# Patient Record
Sex: Female | Born: 1965 | Race: White | Hispanic: No | Marital: Married | Smoking: Former smoker
Health system: Southern US, Community
[De-identification: ages and names within clinical notes are randomized; demographics above are authoritative.]

## PROBLEM LIST (undated history)

## (undated) DIAGNOSIS — R000 Tachycardia, unspecified: Secondary | ICD-10-CM

## (undated) DIAGNOSIS — M810 Age-related osteoporosis without current pathological fracture: Secondary | ICD-10-CM

## (undated) DIAGNOSIS — M4310 Spondylolisthesis, site unspecified: Secondary | ICD-10-CM

## (undated) DIAGNOSIS — I10 Essential (primary) hypertension: Secondary | ICD-10-CM

## (undated) DIAGNOSIS — F419 Anxiety disorder, unspecified: Secondary | ICD-10-CM

## (undated) DIAGNOSIS — E7800 Pure hypercholesterolemia, unspecified: Secondary | ICD-10-CM

## (undated) DIAGNOSIS — E559 Vitamin D deficiency, unspecified: Secondary | ICD-10-CM

## (undated) HISTORY — DX: Age-related osteoporosis without current pathological fracture: M81.0

## (undated) HISTORY — DX: Spondylolisthesis, site unspecified: M43.10

## (undated) HISTORY — DX: Pure hypercholesterolemia, unspecified: E78.00

## (undated) HISTORY — DX: Anxiety disorder, unspecified: F41.9

## (undated) HISTORY — DX: Tachycardia, unspecified: R00.0

## (undated) HISTORY — DX: Vitamin D deficiency, unspecified: E55.9

## (undated) HISTORY — PX: CERVICAL BIOPSY  W/ LOOP ELECTRODE EXCISION: SUR135

## (undated) HISTORY — PX: TUBAL LIGATION: SHX77

## (undated) HISTORY — PX: CHOLECYSTECTOMY: SHX55

## (undated) HISTORY — DX: Essential (primary) hypertension: I10

---

## 1998-12-01 ENCOUNTER — Other Ambulatory Visit: Admission: RE | Admit: 1998-12-01 | Discharge: 1998-12-01 | Payer: Self-pay | Admitting: Family Medicine

## 1998-12-02 ENCOUNTER — Ambulatory Visit (HOSPITAL_COMMUNITY): Admission: RE | Admit: 1998-12-02 | Discharge: 1998-12-02 | Payer: Self-pay | Admitting: Family Medicine

## 1998-12-02 ENCOUNTER — Encounter: Payer: Self-pay | Admitting: Family Medicine

## 1998-12-21 ENCOUNTER — Other Ambulatory Visit: Admission: RE | Admit: 1998-12-21 | Discharge: 1998-12-21 | Payer: Self-pay | Admitting: *Deleted

## 1999-02-06 ENCOUNTER — Ambulatory Visit (HOSPITAL_COMMUNITY): Admission: RE | Admit: 1999-02-06 | Discharge: 1999-02-06 | Payer: Self-pay | Admitting: *Deleted

## 1999-06-02 ENCOUNTER — Other Ambulatory Visit: Admission: RE | Admit: 1999-06-02 | Discharge: 1999-06-02 | Payer: Self-pay | Admitting: *Deleted

## 1999-11-17 ENCOUNTER — Other Ambulatory Visit: Admission: RE | Admit: 1999-11-17 | Discharge: 1999-11-17 | Payer: Self-pay | Admitting: Obstetrics and Gynecology

## 2001-07-30 HISTORY — PX: ABDOMINAL HYSTERECTOMY: SHX81

## 2001-11-18 ENCOUNTER — Other Ambulatory Visit: Admission: RE | Admit: 2001-11-18 | Discharge: 2001-11-18 | Payer: Self-pay | Admitting: *Deleted

## 2002-05-26 ENCOUNTER — Encounter (INDEPENDENT_AMBULATORY_CARE_PROVIDER_SITE_OTHER): Payer: Self-pay | Admitting: *Deleted

## 2002-05-26 ENCOUNTER — Observation Stay (HOSPITAL_COMMUNITY): Admission: RE | Admit: 2002-05-26 | Discharge: 2002-05-27 | Payer: Self-pay | Admitting: Gynecology

## 2002-06-10 ENCOUNTER — Ambulatory Visit (HOSPITAL_COMMUNITY): Admission: RE | Admit: 2002-06-10 | Discharge: 2002-06-10 | Payer: Self-pay | Admitting: Gynecology

## 2002-06-10 ENCOUNTER — Encounter: Payer: Self-pay | Admitting: Gynecology

## 2002-10-23 ENCOUNTER — Encounter: Payer: Self-pay | Admitting: General Surgery

## 2002-10-28 ENCOUNTER — Encounter (INDEPENDENT_AMBULATORY_CARE_PROVIDER_SITE_OTHER): Payer: Self-pay | Admitting: Specialist

## 2002-10-28 ENCOUNTER — Encounter: Payer: Self-pay | Admitting: General Surgery

## 2002-10-28 ENCOUNTER — Observation Stay (HOSPITAL_COMMUNITY): Admission: RE | Admit: 2002-10-28 | Discharge: 2002-10-29 | Payer: Self-pay | Admitting: General Surgery

## 2010-02-14 ENCOUNTER — Encounter: Payer: Self-pay | Admitting: Internal Medicine

## 2010-03-18 ENCOUNTER — Encounter: Payer: Self-pay | Admitting: Internal Medicine

## 2010-03-27 ENCOUNTER — Other Ambulatory Visit: Admission: RE | Admit: 2010-03-27 | Discharge: 2010-03-27 | Payer: Self-pay | Admitting: Gynecology

## 2010-03-27 ENCOUNTER — Ambulatory Visit: Payer: Self-pay | Admitting: Gynecology

## 2010-03-27 ENCOUNTER — Ambulatory Visit (HOSPITAL_COMMUNITY): Admission: RE | Admit: 2010-03-27 | Discharge: 2010-03-27 | Payer: Self-pay | Admitting: Family Medicine

## 2010-03-31 ENCOUNTER — Encounter (INDEPENDENT_AMBULATORY_CARE_PROVIDER_SITE_OTHER): Payer: Self-pay | Admitting: *Deleted

## 2010-05-12 ENCOUNTER — Ambulatory Visit: Payer: Self-pay | Admitting: Internal Medicine

## 2010-05-12 ENCOUNTER — Encounter (INDEPENDENT_AMBULATORY_CARE_PROVIDER_SITE_OTHER): Payer: Self-pay | Admitting: *Deleted

## 2010-05-12 DIAGNOSIS — R198 Other specified symptoms and signs involving the digestive system and abdomen: Secondary | ICD-10-CM | POA: Insufficient documentation

## 2010-05-12 DIAGNOSIS — K219 Gastro-esophageal reflux disease without esophagitis: Secondary | ICD-10-CM

## 2010-05-12 DIAGNOSIS — K5909 Other constipation: Secondary | ICD-10-CM | POA: Insufficient documentation

## 2010-05-15 ENCOUNTER — Telehealth: Payer: Self-pay | Admitting: Internal Medicine

## 2010-05-18 ENCOUNTER — Telehealth: Payer: Self-pay | Admitting: Internal Medicine

## 2010-05-18 DIAGNOSIS — R740 Nonspecific elevation of levels of transaminase and lactic acid dehydrogenase [LDH]: Secondary | ICD-10-CM

## 2010-05-23 ENCOUNTER — Ambulatory Visit: Payer: Self-pay | Admitting: Internal Medicine

## 2010-05-23 LAB — CONVERTED CEMR LAB
ALT: 64 units/L — ABNORMAL HIGH (ref 0–35)
AST: 57 units/L — ABNORMAL HIGH (ref 0–37)
Albumin: 4.4 g/dL (ref 3.5–5.2)
Alkaline Phosphatase: 86 units/L (ref 39–117)
Bilirubin, Direct: 0 mg/dL (ref 0.0–0.3)
Total Bilirubin: 0.4 mg/dL (ref 0.3–1.2)
Total Protein: 7.5 g/dL (ref 6.0–8.3)

## 2010-05-25 ENCOUNTER — Ambulatory Visit: Payer: Self-pay | Admitting: Internal Medicine

## 2010-07-17 ENCOUNTER — Ambulatory Visit: Payer: Self-pay | Admitting: Internal Medicine

## 2010-07-18 ENCOUNTER — Ambulatory Visit (HOSPITAL_COMMUNITY)
Admission: RE | Admit: 2010-07-18 | Discharge: 2010-07-18 | Payer: Self-pay | Source: Home / Self Care | Attending: Family Medicine | Admitting: Family Medicine

## 2010-07-21 ENCOUNTER — Emergency Department (HOSPITAL_COMMUNITY)
Admission: EM | Admit: 2010-07-21 | Discharge: 2010-07-21 | Payer: Self-pay | Source: Home / Self Care | Admitting: Emergency Medicine

## 2010-08-29 NOTE — Letter (Signed)
Summary: [facility] Instructions  [facility]  [address] [place], [place] [address]   Phone: [phone]  Fax: [phone]       [facility]    12-Jan-1966    MRN: [id number]      Procedure Day Dorna Bloom: Lenor Coffin, 05/25/10     Arrival Time: 10 AM      Procedure Time: 11 AM    Location of Procedure:                    _X_   Endoscopy Center (4th Floor)                    PREPARATION FOR COLONOSCOPY WITH MOVIPREP   Starting 5 days prior to your procedure 05/20/10 do not eat nuts, seeds, popcorn, corn, beans, peas,  salads, or any raw vegetables.  Do not take any fiber supplements (e.g. Metamucil, Citrucel, and Benefiber).  THE DAY BEFORE YOUR PROCEDURE         WEDNESDAY, 05/24/10  1.  Drink clear liquids the entire day-NO SOLID FOOD  2.  Do not drink anything colored red or purple.  Avoid juices with pulp.  No orange juice.  3.  Drink at least 64 oz. (8 glasses) of fluid/clear liquids during the day to prevent dehydration and help the prep work efficiently.  CLEAR LIQUIDS INCLUDE: Water Jello Ice Popsicles Tea (sugar ok, no milk/cream) Powdered fruit flavored drinks Coffee (sugar ok, no milk/cream) Gatorade Juice: apple, white grape, white cranberry  Lemonade Clear bullion, consomm, broth Carbonated beverages (any kind) Strained chicken noodle soup Hard Candy                          4.  In the morning, mix first dose of MoviPrep solution:    Empty 1 Pouch A and 1 Pouch B into the disposable container    Add lukewarm drinking water to the top line of the container. Mix to dissolve    Refrigerate (mixed solution should be used within 24 hrs)  5.  Begin drinking the prep at 5:00 p.m. The MoviPrep container is divided by 4 marks.   Every 15 minutes drink the solution down to the next mark (approximately 8 oz) until the full liter is complete.   6.  Follow completed prep with 16 oz of clear liquid of your choice (Nothing red or purple).   Continue to drink clear liquids until bedtime.  7.  Before going to bed, mix second dose of MoviPrep solution:    Empty 1 Pouch A and 1 Pouch B into the disposable container    Add lukewarm drinking water to the top line of the container. Mix to dissolve    Refrigerate  THE DAY OF YOUR PROCEDURE      THURSDAY, 05/25/10  Beginning at 6:00 a.m. (5 hours before procedure):         1. Every 15 minutes, drink the solution down to the next mark (approx 8 oz) until the full liter is complete.  2. Follow completed prep with 16 oz. of clear liquid of your choice.    3. You may drink clear liquids until 9:00 AM (2 HOURS BEFORE PROCEDURE).  MEDICATION INSTRUCTIONS  Unless otherwise instructed, you should take regular prescription medications with a small sip of water   as early as possible the morning of your procedure.       OTHER INSTRUCTIONS  You will need a responsible  adult at least 46 years of age to accompany you and drive you home.   This person must remain in the waiting room during your procedure.  Wear loose fitting clothing that is easily removed.  Leave jewelry and other valuables at home.  However, you may wish to bring a book to read or  an iPod/MP3 player to listen to music as you wait for your procedure to start.  Remove all body piercing jewelry and leave at home.  Total time from sign-in until discharge is approximately 2-3 hours.  You should go home directly after your procedure and rest.  You can resume normal activities the  day after your procedure.  The day of your procedure you should not:   Drive   Make legal decisions   Operate machinery   Drink alcohol   Return to work  You will receive specific instructions about eating, activities and medications before you leave.   The above instructions have been reviewed and explained to me by   _______________________    I fully understand and can verbalize these instructions  _____________________________ Date _________

## 2010-08-29 NOTE — Progress Notes (Signed)
Summary: needs LFT's  Phone Note Outgoing Call   Summary of Call: Please ask her to do LFT's transaminases up in July dx 790.4 if she cannot do til colonoscopy  that is ok Iva Boop MD, [facility]  May 18, 2010 7:56 AM   Follow-up for Phone Call        Pt notified and will come early for her procedure to have drawn at that time. Order in IDX. Follow-up by: Francee Piccolo CMA Duncan Dull),  May 18, 2010 3:27 PM  New Problems: ABNORMAL TRANSAMINASE, (LFT'S) (ICD-790.4)   New Problems: ABNORMAL TRANSAMINASE, (LFT'S) (ICD-790.4)    Appended Document: needs LFT's RC from pt. She is uncomfortable coming the day of her procedure so she will come today for labs.

## 2010-08-29 NOTE — Assessment & Plan Note (Signed)
Summary: constipation//acid reflux--ch.   History of Present Illness Visit Type: Initial Consult Primary GI MD: Stan Head MD [facility] Primary Provider: Karleen Hampshire, MD Requesting Provider: Karleen Hampshire, MD Chief Complaint: alternating diarrhea/constipation and acid reflux primarily at night History of Present Illness:   45 yo ww with 1 year history of constipaton problems. No defecation for up to 9 days then several days of multiple small and soft bowel movements  (while on mineral oil). MiraLax was tried once daily x 5 days without much benefit. Has had constipation for years but worse in last year. Mineral oil works. Stool softeners no help. Also bloated and gassy.  she will get some rges but puts them off "I am shy" and then will take up to an hour to defecate with straining and incomplete defecation. Thinks food digets slowly.  Heartburn has been a problem, manly at night for 3-4 months, sharp pain in epigastrium. this ocurs at about 1-2 AM and awakens her.  Will take Tums or Rolaids and gets relief.  Rare use of hydrocodone. Says TSH ok, LFT's high.  Does not usually leave 3 hours between lying down and last meal of night.  She thinks se eats alot of fiber and added fiber pills to diet.  husband participated in the history.   GI Review of Systems    Reports acid reflux, bloating, and  nausea.      Denies abdominal pain, belching, chest pain, dysphagia with liquids, dysphagia with solids, heartburn, loss of appetite, vomiting, vomiting blood, weight loss, and  weight gain.      Reports change in bowel habits, constipation, and  diarrhea.     Denies anal fissure, black tarry stools, diverticulosis, fecal incontinence, heme positive stool, hemorrhoids, irritable bowel syndrome, jaundice, light color stool, liver problems, rectal bleeding, and  rectal pain. Preventive Screening-Counseling & Management  Alcohol-Tobacco     Smoking Status: quit      Drug Use:  no.       Current Medications (verified): 1)  Metoprolol Tartrate 50 Mg Tabs (Metoprolol Tartrate) .Marland Kitchen.. 1 1/2 Tablet By Mouth Two Times A Day 2)  Simvastatin 20 Mg Tabs (Simvastatin) .Marland Kitchen.. 1 By Mouth At Bedtime 3)  Flexeril 10 Mg Tabs (Cyclobenzaprine Hcl) .Marland Kitchen.. 1 By Mouth Once Daily 4)  Tramadol Hcl 50 Mg Tabs (Tramadol Hcl) .... Take As Needed Pain 5)  Hydrocodone-Acetaminophen 5-325 Mg Tabs (Hydrocodone-Acetaminophen) .... Take As Needed Pain 6)  Xanax 0.5 Mg Tabs (Alprazolam) .... Take 1 By Mouth As Needed 7)  Mineral Oil  Oil (Mineral Oil) .Marland Kitchen.. 1 Teaspoon Twice A Day  Allergies (verified): No Known Drug Allergies  Past History:  Past Medical History: Anxiety Disorder Hyperlipidemia Hypertension Arrhythmia Arthritis  Past Surgical History: Reviewed history from 05/12/2010 and no changes required. Cholecystectomy Hysterectomy Right Salpingo-Oopherectomy  Family History: Family History of Colon Cancer: Father age 94 and  PGF 26's Family History of Colitis/Crohn's:father UC  Social History: Reviewed history and no changes required. 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  Alcohol Use - no Daily Caffeine Use 2/day Illicit Drug Use - no Smoking Status:  quit Drug Use:  no  Review of Systems       The patient complains of back pain and change in vision.         All other ROS negative except as per HPI.   Vital Signs:  Patient profile:   45 year old female Height:      60 inches  Weight:      154 pounds BMI:     30.18 Pulse rate:   126 / minute Pulse rhythm:   regular BP sitting:   118 / 72  (left arm)  Vitals Entered By: Milford Cage NCMA (May 12, 2010 3:04 PM)  Physical Exam  General:  obese.  NAD Eyes:  PERRLA, no icterus. Mouth:  No deformity or lesions, dentition normal. Neck:  Supple; no masses or thyromegaly. Lungs:  Clear throughout to auscultation. Heart:  Regular rate and rhythm; no murmurs, rubs,  or  bruits. Abdomen:  obese, soft, nontender without HSM/mass Rectal:  deferred until time of colonoscopy.   Extremities:  No clubbing, cyanosis, edema or deformities noted. Cervical Nodes:  No significant cervical or supraclavicular adenopathy.  Psych:  Alert and cooperative. Normal mood and affect.  Dr. Edison Simon notes reviewed, lab results requested.  Impression & Recommendations:  Problem # 1:  CHANGE IN BOWELS (NFA-213.08) Assessment New  Worsening constipation problems. In conjunction with family hx of colon cancer (father and grandfather -  though dad's likely linked to UC), will evaluate with colonoscopy also check the labs she had at Dr. Edison Simon office - offce notes sent and reviewed but no labs try to do rectal exam before hse is sedated   Orders: Colonoscopy (Colon)  Problem # 2:  CONSTIPATION, CHRONIC (ICD-564.09) Assessment: New  Problem # 3:  GERD (ICD-530.81) Assessment: New seems lifestyle related with late meals we discussed this and will try an H2 blocker in the evening and also consider raising HOB Ultimately need to discuss weight loss also  Patient Instructions: 1)  Try to raise the head of your bed and give at least 3 hours between supper and lying down to sleep, if possible. 2)  This may alleviate your nighttime reflux. 3)  Ranitidine was also prescribed to try to help that. 4)  GI Reflux brochure given.  5)  Please pick up your medications at your pharmacy.  6)  We will see you at your procedure on 05/25/10. 7)  [facility] Patient Information Guide given to patient.  8)  Colonoscopy and Flexible Sigmoidoscopy brochure given.  9)  Copy sent to : Karleen Hampshire, MD 10)  The medication list was reviewed and reconciled.  All changed / newly prescribed medications were explained.  A complete medication list was provided to the patient / caregiver. Prescriptions: MOVIPREP 100 GM  SOLR (PEG-KCL-NACL-NASULF-NA ASC-C) As per prep instructions.   #1 x 0   Entered by:   Francee Piccolo CMA (AAMA)   Authorized by:   Iva Boop MD, [facility]   Signed by:   Francee Piccolo CMA (AAMA) on 05/12/2010   Method used:   Electronically to        [facility] (retail)       [address]       [place], [place]  [address]       Ph: [phone]       Fax: [phone]   RxID:   [id number] RANITIDINE HCL 150 MG TABS (RANITIDINE HCL) 1 by mouth each night after supper but before bedtime  #30 x 1   Entered and Authorized by:   Iva Boop MD, [facility]   Signed by:   Iva Boop MD, FACG on 05/12/2010   Method used:   Electronically to        [facility] (retail)       [address]       [place], [place]  [address]       Ph: [phone]       Fax: [phone]   RxID:   [id number]   Appended Document: constipation//acid reflux--ch. elevated transaminases lipids also elevated negative acute hepatitis panel  fatty liver likely, meds possible levels need reasessment and will coordinate at colonoscopy or before

## 2010-08-29 NOTE — Letter (Signed)
Summary: New Patient letter  [facility]  [address] [place], [place] [address]   Phone: [phone]  Fax: [phone]       03/31/2010 MRN: [id number]  [facility] [address] [place], [place]  [address]  Dear Ms. Cwikla,  Welcome to the Gastroenterology Division at [facility].    You are scheduled to see Dr.  Leone Payor on 05-12-10 at 2:45p.m. on the 3rd floor at [facility], [address]  We ask that you try to arrive at our office 15 minutes prior to your appointment time to allow for check-in.  We would like you to complete the enclosed self-administered evaluation form prior to your visit and bring it with you on the day of your appointment.  We will review it with you.  Also, please bring a complete list of all your medications or, if you prefer, bring the medication bottles and we will list them.  Please bring your insurance card so that we may make a copy of it.  If your insurance requires a referral to see a specialist, please bring your referral form from your primary care physician.  Co-payments are due at the time of your visit and may be paid by cash, check or credit card.     Your office visit will consist of a consult with your physician (includes a physical exam), any laboratory testing he/she may order, scheduling of any necessary diagnostic testing (e.g. x-ray, ultrasound, CT-scan), and scheduling of a procedure (e.g. Endoscopy, Colonoscopy) if required.  Please allow enough time on your schedule to allow for any/all of these possibilities.    If you cannot keep your appointment, please call [phone] to cancel or reschedule prior to your appointment date.  This allows [place] the opportunity to schedule an appointment for another patient in need of care.  If you do not cancel or reschedule by 5 p.m. the business day prior to your appointment date, you will be charged a $50.00 late cancellation/no-show fee.    Thank you for choosing  [facility] for your medical needs.  We appreciate the opportunity to care for you.  Please visit [place] at our website  to learn more about our practice.                     Sincerely,                                                             The Gastroenterology Division

## 2010-08-29 NOTE — Progress Notes (Signed)
Summary: Medicine not at pharmacy.  Phone Note Call from Patient Call back at Home Phone [phone]   Call For: Dr Leone Payor Summary of Call: Still waiting for her medcine to be sent to pharmacy since friday. Initial call taken by: Leanor Kail [facility],  May 15, 2010 11:57 AM  Follow-up for Phone Call        LM to [facility] at home number Francee Piccolo CMA Duncan Dull)  May 15, 2010 1:17 PM  Spoke to pharm at [facility] did not receive rx's on Friday.  Verbal rx given to pharm.  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 Francee Piccolo CMA Duncan Dull)  May 16, 2010 9:46 AM   RC from pt.  Notified of above.   Follow-up by: Francee Piccolo CMA Duncan Dull),  May 16, 2010 11:14 AM

## 2010-08-29 NOTE — Letter (Signed)
Summary: [facility]   Imported By: Lester [place] 05/23/2010 10:36:51  _____________________________________________________________________  External Attachment:    Type:   Image     Comment:   External Document  Appended Document: Colonoscopy    Clinical Lists Changes  Observations: Added new observation of COLONNXTDUE: 04/2020 (05/25/2010 12:46)

## 2010-08-31 NOTE — Procedures (Signed)
Summary: Colonoscopy  Patient: Monica [name] Note: All result statuses are Final unless otherwise noted.  Tests: (1) Colonoscopy (COL)   COL Colonoscopy           DONE     [facility]     [address]     [place], [place]  [address]           COLONOSCOPY PROCEDURE REPORT           PATIENT:  Monica, [name]  MR#:  [id number]     BIRTHDATE:  [birth year], 44 yrs. old  GENDER:  female     ENDOSCOPIST:  Iva Boop, MD, [facility]     REF. BY:  Karleen Hampshire, M.D.     PROCEDURE DATE:  05/25/2010     PROCEDURE:  Colonoscopy 19[address]     ASA CLASS:  Class II     INDICATIONS:  change in bowel habits worsening constipation     MEDICATIONS:   Fentanyl 100 mcg IV, Versed 8 mg IV, Benadryl 12.5     mcg IV           DESCRIPTION OF PROCEDURE:   After the risks benefits and     alternatives of the procedure were thoroughly explained, informed     consent was obtained.  Digital rectal exam was performed and     revealed no abnormalities.   The LB [id number] endoscope     was introduced through the anus and advanced to the cecum, which     was identified by both the appendix and ileocecal valve, without     limitations.  The quality of the prep was excellent, using     MoviPrep.  The instrument was then slowly withdrawn as the colon     was fully examined. Insertion: 4:49 minutes Withdrawal:7:07     minutes     <<PROCEDUREIMAGES>>           FINDINGS:  A normal appearing cecum, ileocecal valve, and     appendiceal orifice were identified. The ascending, hepatic     flexure, transverse, splenic flexure, descending, sigmoid colon,     and rectum appeared unremarkable.   Retroflexed views in the right     colon and  rectum revealed no abnormalities.    The scope was then     withdrawn from the patient and the procedure completed.           COMPLICATIONS:  None     ENDOSCOPIC IMPRESSION:     1) Normal colonoscopy, excellent prep     RECOMMENDATIONS:     1) Retry MiraLax 1-2  doses a day - start tomorrow     2) Stop mineral oil     3) If #1 fails after a few weeks call  back and Amitiza can be     prescribed     4) Liver tests are mildly elevated - need to recheck them in 2     months - we will call     REPEAT EXAM:  In 10 year(s) for routine screening colonoscopy.           Iva Boop, MD, [facility]           CC:  Karleen Hampshire, MD     The Patient           n.     [name]:   Iva Boop at 05/25/2010 11:49 AM  [name], [name], [id number]  Note: An exclamation mark (!) indicates a result that was not dispersed into the flowsheet. Document Creation Date: 05/25/2010 11:50 AM _______________________________________________________________________  (1) Order result status: Final Collection or observation date-time: 05/25/2010 11:38 Requested date-time:  Receipt date-time:  Reported date-time:  Referring Physician:   Ordering Physician: Stan Head [phone]) Specimen Source:  Source: [name] Order Number: [id number] Lab site:   Appended Document: Colonoscopy    Clinical Lists Changes  Observations: Added new observation of COLONNXTDUE: 04/2020 (05/25/2010 12:46)      Appended Document: Colonoscopy I have left a message for the patient to come for lab work this week

## 2010-10-09 LAB — CK: Total CK: 42 U/L (ref 7–177)

## 2010-12-15 NOTE — Op Note (Signed)
   [name], [name]                       ACCOUNT NO.:  [id number]   MEDICAL RECORD NO.:  [id number]                   PATIENT TYPE:  OBV   LOCATION:  9137                                 FACILITY:  [facility]   PHYSICIAN:  Juan H. Lily Peer, M.D.             DATE OF BIRTH:  Feb 25, 1966   DATE OF PROCEDURE:  DATE OF DISCHARGE:                                 OPERATIVE REPORT   No dictation for this job.                                               Juan H. Lily Peer, M.D.    JHF/MEDQ  D:  05/26/2002  T:  05/27/2002  Job:  [id number]

## 2010-12-15 NOTE — Op Note (Signed)
Monica [name], Monica [name]                       ACCOUNT NO.:  [id number]   MEDICAL RECORD NO.:  [id number]                   PATIENT TYPE:  OBV   LOCATION:  9137                                 FACILITY:  [facility]   PHYSICIAN:  Juan H. Lily Peer, M.D.             DATE OF BIRTH:  [birth year]   DATE OF PROCEDURE:  05/26/2002  DATE OF DISCHARGE:                                 OPERATIVE REPORT   PREOPERATIVE DIAGNOSES:  1. Dysmenorrhea.  2. Menorrhagia.  3. Dyspareunia.  4. Rule out endometriosis.   POSTOPERATIVE DIAGNOSES:  1. Dysmenorrhea.  2. Menorrhagia.  3. Dyspareunia.  4. Rule out endometriosis.   PROCEDURES:  1. Laparoscopically-assisted vaginal hysterectomy.  2. Pelvic adhesiolysis.  3. Right salpingo-oophorectomy.   SURGEON:  Juan H. Lily Peer, M.D.   ASSISTANT:  Rande Brunt. Eda Paschal, M.D.   ANESTHESIA:  General endotracheal anesthesia.   INDICATION FOR PROCEDURE:  A 45 year old with previous sterilization  procedure, with long-standing history of dysmenorrhea, menorrhagia, and  dyspareunia, symptomatology consistent, suspicious for endometriosis.   FINDINGS:  1. Endometriosis of the right fallopian tube.  2. Endometriosis of the right ovary.  3. Right adnexal adhesions.  4. Normal left ovary and tube and uterus.   DESCRIPTION OF PROCEDURE:  After the patient was adequately counseled, she  was taken to the operating room, where she underwent a successful general  endotracheal anesthesia.  She was placed in the low lithotomy position and  the abdomen, vagina, and perineum were prepped and draped in the usual  sterile fashion.  A Foley catheter was inserted in an effort to monitor  urinary output.  A small incision was made in the infraumbilical region, a  Veress needle was inserted.  Opening intra-abdominal pressure was recorded  at 5 mmHg.  Approximately 3 L of carbon dioxide were insufflated into the  peritoneal cavity.  The Veress needle was removed.  The 10 mm  trocar was  inserted.  The sleeve was left in place.  Then the laparoscope was inserted  and visualization of the pelvic cavity was started.  Prior to complete  thorough inspection of the pelvic cavity, two additional 5 mm ports were  inserted approximately four fingerbreadths from the midline and at the lower  abdomen under laparoscopic guidance.  A systematic inspection demonstrated  an endometriotic implant at the level of the previous sterilization  procedure on the right fallopian tube and a suspicious area on the ovary  that appears to be endometriosis or evidence of hemorrhagic cyst, although  by its appearance and the underlying peritoneal surface that appears to be a  hemosiderin deposit, made it highly suspicious with these findings to be  endometriosis, and also an adhesive band coming from the anterior abdominal  wall to this area made it more highly suspicious, and along with the  patient's pain.  Thus, it was decided to proceed with the operation  laparoscopically with removal of the  right adnexa as well as had previously  been discussed with the patient.  The rest of the uterus and cul-de-sac  looked fine.  The left tube and ovary were normal.  The procedure was  started as follows:  With the usage of the grasping tripolar cutting unit,  the right infundibulopelvic ligament was cauterized and transected with full  visualization of the right ureter distal to this.  After the  infundibulopelvic ligament was cauterized and cut, the round ligament was  then cauterized and transected as well, and the parametrial tissue was  cauterized and transected and the bladder flap was established to the level  of the internal cervical os.  Attention was then placed to the left adnexal  region.  The left ureter was identified.  The left ovary window was normal,  so the left ovary and tube were left in place.  With the utilization once  again of the grasping and cauterizing cutting tripolar  unit, the left tube  and utero-ovarian ligament were cauterized and transected as well as the  proximal portion of the fallopian tube, and the broad ligament and cardinal  ligament were cauterized and transected and the bladder flap was established  to the level of the lower uterine segment.  The pelvic cavity was copiously  irrigated with normal saline solution.  Inspection of the pelvic cavity  revealed adequate hemostasis, and the second portion of the operation was  undertaken by doing the transvaginal hysterectomy portion.  The patient was  then placed in the high lithotomy position.  The Foley catheter was still  left in place.  A short _____ weighted speculum was placed in the posterior  vaginal vault.  Two Lahey abdominal clamps were placed on the anterior and  posterior cervical lip, respectively, for traction.  Xylocaine 1% with  1:200,000 epinephrine was infiltrated into the cervicovaginal fold.  A  circumferential incision was made at the cervicovaginal fold.  Posterior  colpotomy was established.  The long weighted speculum was inserted into the  cul-de-sac.  Both right and left uterosacral ligaments were clamped, cut,  and suture ligated.  The anterior colpotomy was performed without  complications.  The remaining broad and cardinal ligaments were clamped,  cut, and suture ligated with 0 Vicryl suture and the uterus, cervix, and  right tube and ovary passed off the operative field.  Inspection of the  pedicles appeared to demonstrate good hemostasis, and the vaginal cuff was  closed with interrupted sutures of 0 Vicryl suture.  The vagina was  irrigated with normal saline solution.  The legs were brought down to the  low lithotomy position at this point, and a reinspection through the  laparoscope again to make sure that the pedicles were dry, which was  confirmed.  The pelvic cavity was once again irrigated with normal saline solution and good hemostasis was evident.  Both 5  mm trocars were removed  under laparoscopic visualization with no active bleeding, and the 10 mm  trocar was removed after the peritoneal cavity CO2 was removed.  The 10 mm  trocar site fascia was closed with a figure-of-eight suture of 0 Vicryl  suture with the use of the GRP6 needle.  The subcutaneous tissue was  reapproximated with 4-0 plain catgut suture and for further approximation of  the skin, Dermabond was utilized.  Dermabond was also utilized at both 5 mm  trocar sites and 0.25% Marcaine was infiltrated for a total of 10 cc in all  three incision port  sites for postoperative analgesia.  The patient was  extubated, transferred to recovery with stable vital signs.  Blood loss for  the procedure was recorded at 100 cc, IV fluid was 2 L, and urine output was  200 cc and clear.  She did receive 1 g of Cefotan prophylactically, and she  had pneumatic compression stockings for DVT prophylaxis as well.                                                Juan H. Lily Peer, M.D.    JHF/MEDQ  D:  05/26/2002  T:  05/27/2002  Job:  [id number]

## 2010-12-15 NOTE — H&P (Signed)
Monica [name], CANBY                       ACCOUNT NO.:  [id number]   MEDICAL RECORD NO.:  [id number]                   PATIENT TYPE:  OBV   LOCATION:  9399                                 FACILITY:  [facility]   PHYSICIAN:  Juan H. Lily Peer, M.D.             DATE OF BIRTH:  [birth year]   DATE OF ADMISSION:  05/26/2002  DATE OF DISCHARGE:                                HISTORY & PHYSICAL   CHIEF COMPLAINT:  Dysmenorrhea, menorrhagia, dyspareunia and chronic pelvic  pain.   HISTORY:  The patient is a 45 year old  gravida 2, para 2 who was seen as a  new patient in the office on September 18th.  She had previously been  followed by another gynecologist, [name], for many years.  She had been  complaining for a long period of time of menorrhagia and dysmenorrhea.  Her  periods have been lasting for 14-21 days a month.  She is also having  dyspareunia.  She feels bloated, fatigued and tired; and, has been tried on  numerous progestational agents along with hormonal manipulation of different  sorts in the past without success.  She had an ultrasound and sonohystogram  in July of this year, which demonstrated on saline infusion that she had an  8 mm x 4 mm x 3 mm echogenic focus attached to the wall of the uterus  consistent with a polyp.  Her ovaries were reported to be normal.  She had  been at wits end with this type of pain and the amount of bleeding.  She had  been previously been offered by another practitioner as myself laparoscopy  to rule out endometriosis or pelvic adhesions along with a resectoscopic  polypectomy.  The patient stated that she wanted definitive surgery and  children are no longer an issue since she has had a tubal sterilization in  the past.  She did have an endometrial biopsy on September the 18th, which  demonstrated interval phase endometrium.  Her recent Pap smear had been done  this year, which was reported to be normal as well.  She was seen in the  office on  September 29th for an ultrasound, which was essentially  unremarkable. She went through extensive counseling session at that point  and she wanted to proceed with a hysterectomy due to her symptoms.  To rule  out the possibility of endometriosis or adhesions we will proceed with a  laparoscopic assisted vaginal hysterectomy with ovarian conservation.   PAST MEDICAL HISTORY:  1. She had the LEEP surgical procedure many years ago.  2. She has had a postpartum tubal sterilization procedure.  3. Her children both were delivered vaginally.   ALLERGIES:  She denies any allergies.   MEDICATIONS:  1. She takes Xanax for anxiety.  2. Flexeril for tension headaches as well as Advil.  3. Vitamins.   The patient suffered in the past fatigue and constipation as mentioned above  with  severe bloating with pain mostly all the time.   REVIEW OF SYSTEMS:  Unremarkable.   PHYSICAL EXAMINATION:  VITAL SIGNS:  The patient's weight is 132 pounds.  Height 5 feet 0 inches tall.  Blood pressure 122/70.  HEENT:  Unremarkable.  NECK:  Supple.  Trachea midline.  No carotid bruits.  No thyromegaly.  LUNGS:  Clear to auscultation without rhonchi or wheezes.  HEART:  Regular rate and rhythm.  No murmurs or gallops.  RECTAL:  Exam not done.  ABDOMEN:  Soft and nontender without rebound or guarding.  PELVIC:  Bartholin, urethral and Skene's are within normal limits.  Vagina  and cervix; no lesions or discharge.  Uterus anteverted, normal, shape and  consistency.  Adnexa without masses or tenderness.   ASSESSMENT:  A 45 year old gravida 2, para 2 with longstanding history of  dysmenorrhea, menorrhagia, dyspareunia, bloating, and pelvic pain.   She is scheduled to undergo laparoscopic assisted vaginal hysterectomy to  rule out the possibility of underlying endometriosis and/or adhesions.  Her  ultrasound on September 29th was reported to be normal.  She had been  provided with literature and information on  laparoscopic assisted vaginal  hysterectomy.  We had a lengthy discussion during her preop consultation to  discuss the risks, benefits, pros and cons of laparoscopic surgery with its  potential complications including deep venous thrombosis and pulmonary  embolism.  She is smoker and was instructed to discontinue her smoking  habit.  She will have pneumatic compression stockings placed.  Her risk of  infection, although she will prophylactic antibiotic, and also in the event  of uncontrollable hemorrhage where the patient may need blood transfusion  which carries the potential risks of anaphylactic reaction, hepatitis and  AIDS.  Finally, if there is any clinical difficulty entering the abdominal  cavity or any trauma to internal organs, or extensive endometriosis is seen  and not able to complete the operation laparoscopically she may need an open  laparotomy technique in order to complete the operation, which could also  require the patient to stay in the hospital an additional day.  In the event  of extensive endometriosis involving both ovaries they could potentially  need to be removed and patient is fully aware that she may need to be on  hormone replacement therapy for the remainder of her life.  She is fully  aware that although we may proceed with the above-mentioned operation that  her symptoms may not get any better and she may need to be followed by a  gastroenterologist.  All these issues were discussed with the patient in  detail.  All questions were answered and will follow accordingly.   PLAN:  The patient is scheduled for laparoscopic assisted vaginal  hysterectomy with ovarian conservation scheduled for Tuesday, October 28th  at 7:30 A.M. at [facility].                                               [place]. Lily Peer, M.D.    JHF/MEDQ  D:  05/25/2002  T:  05/26/2002  Job:  [id number]

## 2010-12-15 NOTE — Op Note (Signed)
Monica [name], Monica [name]                       ACCOUNT NO.:  [id number]   MEDICAL RECORD NO.:  [id number]                   PATIENT TYPE:  AMB   LOCATION:  DAY                                  FACILITY:  [facility]   PHYSICIAN:  Ollen Gross. Vernell Morgans, M.D.              DATE OF BIRTH:  [birth year]   DATE OF PROCEDURE:  10/28/2002  DATE OF DISCHARGE:                                 OPERATIVE REPORT   PREOPERATIVE DIAGNOSES:  Cholelithiasis.   POSTOPERATIVE DIAGNOSES:  Cholelithiasis.   PROCEDURE:  Laparoscopic cholecystectomy with intraoperative cholangiogram.   SURGEON:  Ollen Gross. Carolynne Edouard, M.D.   ASSISTANT:  Donnie Coffin. Samuella Cota, M.D.   ANESTHESIA:  General endotracheal.   DESCRIPTION OF PROCEDURE:  After informed consent was obtained, the patient  was brought to the operating room, placed in supine position on the  operating table. After adequate induction of general anesthesia, the  patient's abdomen was prepped with Betadine and draped in the usual sterile  manner. The area below the umbilicus was infiltrated with 0.25% Marcaine, a  small incision was made with the 15 blade knife. This incision was carried  down through the subcutaneous tissue bluntly with a Kelly clamp and [employer] retractors until the linea alba was identified. The linea alba was  incised with a 15 blade knife and each side was grasped with Kocher clamps  and elevated anteriorly. The preperitoneal space was then probed bluntly  with a hemostat until the peritoneum was opened and access was gained to the  abdominal cavity. A finger was inserted through the opening and there were  no obvious adhesions to the anterior abdominal wall. A #0 Vicryl pursestring  stitch was placed in the fascia surrounding this opening. A Hasson cannula  was placed through the opening and anchored in place with the previously  placed Vicryl pursestring stitch. The abdomen was then insufflated with  carbon dioxide without difficulty, the patient  was placed in the head up  position and a laparoscope was placed through the Hasson cannula. The right  upper quadrant was inspected and the dome of the gallbladder and liver were  readily identified. The epigastric region was then infiltrated with 0.25%  Marcaine and a small incision was made with a 15 blade knife and a 10 mm  port was placed bluntly through this incision into the abdominal cavity  under direct vision. Sites were then chosen laterally on the right side of  the abdomen for placement of 5 mm ports. Each of these areas was infiltrated  with 0.5% Marcaine, small stab incisions were made with a 15 blade knife, 5  mm ports were then placed bluntly through these incisions into the abdominal  cavity under direct vision. A blunt grasper was placed through the lateral  most 5 mm port and used to grasp the dome of the gallbladder and elevate it  anteriorly and superiorly. Another blunt grasper was placed  through the  other 5 mm port and used to retract on the body and neck of the gallbladder.  A dissector was placed through the upper midline and using the  electrocautery the peritoneal reflection at the gallbladder neck was opened,  blunt dissection was then carried out in this area until the gallbladder  neck cystic duct junction was readily identified and a good window was  created. A single clip was placed proximally on the neck of the gallbladder.  A small ductotomy was made with laparoscopic scissors. A 14 gauge  Angiocatheter was then placed percutaneously through the anterior abdominal  wall under direct vision. A Reddick cholangiogram catheter was placed  through the Angiocath and flushed. The Reddick catheter was then placed  within the cystic duct and anchored in place with a clip. A cholangiogram  was obtained that showed no filling defects, good emptying into the  duodenum, and adequate length although somewhat shortened of the cystic  duct. The anchoring clip and  catheter was then removed, two clips were  placed proximally on the cystic duct and the duct was divided between the  two sets of clips. Posterior to this, the cystic artery was identified and  again dissected in a circumferential manner bluntly until a good window was  created. Two clips were placed proximally on the artery and one distally and  the artery was divided between the two. Next, the laparoscopic Cook cautery  device was used to separate the gallbladder from the liver bed. Prior to  completely detaching the gallbladder from the liver bed, the liver bed was  inspected and several small bleeding points were coagulated with the  electrocautery. The liver bed was hemostatic. The gallbladder was then  detached the rest of the way from the liver bed without difficulty. The  laparoscope was then placed through the epigastric port and a gallbladder  grasper was placed through the Hasson cannula, used to grasp the neck of the  gallbladder. The gallbladder was then removed with the Hasson cannula  through the infraumbilical port. The fascial defect was closed with the  previously placed Vicryl pursestring stitch, the abdomen was irrigated with  copious amounts of saline until the affluent was clear. There were no other  obvious abnormalities on inspection of the rest of the abdomen. The ports  were then removed under direct vision and found to be hemostatic. The gas  was allowed to escape. The skin incisions were closed with interrupted 4-0  Monocryl subcuticular stitches. Benzoin and Steri-Strips were applied. The  patient tolerated the procedure well and at the end of the case all sponge,  needle and instrument counts were correct. The patient was awakened and  taken to the recovery room in stable condition.                                               Ollen Gross. Vernell Morgans, M.D.    PST/MEDQ  D:  10/28/2002  T:  10/28/2002  Job:  [id number]

## 2011-05-08 ENCOUNTER — Telehealth: Payer: Self-pay | Admitting: *Deleted

## 2011-05-08 DIAGNOSIS — B373 Candidiasis of vulva and vagina: Secondary | ICD-10-CM

## 2011-05-08 MED ORDER — FLUCONAZOLE 150 MG PO TABS: 150.0000 mg | ORAL_TABLET | Freq: Once | ORAL | Status: AC

## 2011-05-08 NOTE — Telephone Encounter (Signed)
Pt called c/o possible  yeast infection x 1 week now? She took OTC monistat but no relief. Itching ,white discharge, foul odor. She had hysterectomy, annual scheduled on sept 18 th. Pt would like rx sent to pharmacy to help relieve infection. Please advise if pt should check with appointment desk to be seen sooner or can rx be given.

## 2011-05-08 NOTE — Telephone Encounter (Signed)
Per Dr.Jf pt can have diflucan 150 mg. If no relief pt should make office visit.

## 2011-05-14 ENCOUNTER — Encounter: Payer: Self-pay | Admitting: Anesthesiology

## 2011-05-17 ENCOUNTER — Encounter: Payer: Self-pay | Admitting: Gynecology

## 2011-06-04 ENCOUNTER — Other Ambulatory Visit (HOSPITAL_COMMUNITY)
Admission: RE | Admit: 2011-06-04 | Discharge: 2011-06-04 | Disposition: A | Discharge status: Home / Self Care | Payer: 59 | Source: Ambulatory Visit | Attending: Gynecology | Admitting: Gynecology

## 2011-06-04 ENCOUNTER — Encounter: Payer: Self-pay | Admitting: Gynecology

## 2011-06-04 ENCOUNTER — Ambulatory Visit (INDEPENDENT_AMBULATORY_CARE_PROVIDER_SITE_OTHER): Payer: 59 | Admitting: Gynecology

## 2011-06-04 DIAGNOSIS — R300 Dysuria: Secondary | ICD-10-CM

## 2011-06-04 DIAGNOSIS — N951 Menopausal and female climacteric states: Secondary | ICD-10-CM

## 2011-06-04 DIAGNOSIS — R102 Pelvic and perineal pain: Secondary | ICD-10-CM

## 2011-06-04 DIAGNOSIS — Z01419 Encounter for gynecological examination (general) (routine) without abnormal findings: Secondary | ICD-10-CM | POA: Insufficient documentation

## 2011-06-04 DIAGNOSIS — N898 Other specified noninflammatory disorders of vagina: Secondary | ICD-10-CM

## 2011-06-04 DIAGNOSIS — N949 Unspecified condition associated with female genital organs and menstrual cycle: Secondary | ICD-10-CM

## 2011-06-04 DIAGNOSIS — B379 Candidiasis, unspecified: Secondary | ICD-10-CM

## 2011-06-04 DIAGNOSIS — E7800 Pure hypercholesterolemia, unspecified: Secondary | ICD-10-CM | POA: Insufficient documentation

## 2011-06-04 DIAGNOSIS — R635 Abnormal weight gain: Secondary | ICD-10-CM

## 2011-06-04 MED ORDER — TERCONAZOLE 0.8 % VA CREA: 1.0000 | TOPICAL_CREAM | Freq: Every day | VAGINAL | Status: AC

## 2011-06-04 NOTE — Progress Notes (Signed)
Monica [name] 06-23-1966 [id number]   History:    45 y.o.  for annual exam with complaint of one-month history of lower, no pressure and dysuria. She had called the office and had been given a prescription of Diflucan 150 mg which she took for one day. She continues her same symptoms. She denies any fever chills nausea or vomiting. She was complaining of a slight vaginal discharge. She has not been sexually active in a year. Her primary physician has done all her labs and they're up-to-date. She does her monthly self breast examination. Her last mammogram was in August of 2011. Patient with history in 2003 of an LAVH/RSO. Patient having vasomotor symptoms consisting of hot flashes irritability vaginal atrophy and mood swings.  Past medical history,surgical history, family history and social history were all reviewed and documented in the EPIC chart.  Gynecologic History No LMP recorded. Patient has had a hysterectomy. Contraception: LAVH/RSO Last Pap: 2011. Results were:normal} Last mammogram: 2011. Results were: normal  Obstetric History OB History    Grav Para Term Preterm Abortions TAB SAB Ect Mult Living   2 2 2       2      # Outc Date GA Lbr Len/2nd Wgt Sex Del Anes PTL Lv   1 TRM     F SVD   Yes   2 TRM     M SVD   Yes       ROS:  Was performed and pertinent positives and negatives are included in the history.  Exam: chaperone present  BP 126/82  Ht 4\' 11"  (1.499 m)  Wt 151 lb (68.493 kg)  BMI 30.50 kg/m2  Body mass index is 30.50 kg/(m^2).  General appearance : Well developed well nourished female. No acute distress HEENT: Neck supple, trachea midline, no carotid bruits, no thyroidmegaly Lungs: Clear to auscultation, no rhonchi or wheezes, or rib retractions  Heart: Regular rate and rhythm, no murmurs or gallops Breast:Examined in sitting and supine position were symmetrical in appearance, no palpable masses or tenderness,  no skin retraction, no nipple inversion, no  nipple discharge, no skin discoloration, no axillary or supraclavicular lymphadenopathy Abdomen: no palpable masses or tenderness, no rebound or guarding Extremities: no edema or skin discoloration or tenderness  Pelvic:  Bartholin, Urethra, Skene Glands: Within normal limits             Vagina: No gross lesions or discharge  Cervix: Absent  Uterus  absent,  Adnexa  Without masses or tenderness  Anus and perineum  normal   Rectovaginal  normal sphincter tone without palpated masses or tenderness             Hemoccult not done     Assessment/Plan:  45 y.o. female for annual exam with perimenopausal like symptoms we'll check an [facility] today. Because of her vaginal discharge with a wet prep which demonstrated moniliasis she will be placed on Terazol to apply each bedtime intravaginally. Her urinalysis was negative we'll run a urine culture. We'll also check her [facility] because of her vasomotor symptoms. Because of abdominal bloating and lower, we'll discomfort we'll have an ultrasound next week and when she returned back we'll discuss these results. She was given literature formation on hormone replacement therapy and on the menopause. Requisition to schedule her next mammogram in the next few weeks was provided as well. She is to take calcium and vitamin D for osteoporosis prevention. We'll follow accordingly.    Ok Edwards MD, 1:18 PM 06/04/2011

## 2011-06-14 ENCOUNTER — Ambulatory Visit (INDEPENDENT_AMBULATORY_CARE_PROVIDER_SITE_OTHER): Payer: 59

## 2011-06-14 ENCOUNTER — Encounter: Payer: Self-pay | Admitting: Gynecology

## 2011-06-14 ENCOUNTER — Ambulatory Visit (INDEPENDENT_AMBULATORY_CARE_PROVIDER_SITE_OTHER): Payer: 59 | Admitting: Gynecology

## 2011-06-14 VITALS — BP 130/70

## 2011-06-14 DIAGNOSIS — N951 Menopausal and female climacteric states: Secondary | ICD-10-CM

## 2011-06-14 DIAGNOSIS — N949 Unspecified condition associated with female genital organs and menstrual cycle: Secondary | ICD-10-CM

## 2011-06-14 DIAGNOSIS — Z780 Asymptomatic menopausal state: Secondary | ICD-10-CM

## 2011-06-14 DIAGNOSIS — R102 Pelvic and perineal pain: Secondary | ICD-10-CM

## 2011-06-14 MED ORDER — ESTROGENS CONJUGATED 0.625 MG PO TABS: 0.6250 mg | ORAL_TABLET | Freq: Every day | ORAL | Status: DC

## 2011-06-14 NOTE — Progress Notes (Signed)
45 year old patient who was seen in the office for annual gynecological examination on November 5. Patient been complaining of vasomotor symptoms consisting of hot flashes irritability mood swing and vaginal dryness. Patient with prior history of an LAVH/RSO in 2003. Patient's recent Pap smear was normal and she is in the process of scheduling her mammogram. Her FSH was found to be elevated at 62.  We went through a lengthy discussion of hormone replacement therapy and menopause. Literature information had previously been provided before today's consultation. On questioning patient's mother never had breast cancer but she said that she was informed it was premalignant cells and they did a lumpectomy. We discussed different forms of hormone replacement therapy. Since she's had a hysterectomy only estrogen will be needed to help with her quality of life issues. We discussed the oral as well as transdermal as well as transvaginal routes. Patient would like to take the oral tablet. We'll place her on a intermediate dose of Premarin 0.625 mg to take 1 by mouth daily. Patient's fully aware of the risks and benefits pros and cons of hormone replacement therapy and fully except. She understands importance of monthly self breast examination as well as annual mammograms and breast exam by physician. She was encouraged to continue to take her calcium vitamin D for osteoporosis prevention and next year will plan on doing a bone density study.  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 Patient states that she has difficulty urinating at times she does endorse frequent she denies any stress urinary  incontinence. She will be referred to our urology colleagues for further evaluation for possible neurogenic bladder.

## 2011-07-03 ENCOUNTER — Telehealth: Payer: Self-pay | Admitting: *Deleted

## 2011-07-03 MED ORDER — ESTRADIOL 1 MG PO TABS: 1.0000 mg | ORAL_TABLET | Freq: Every day | ORAL | Status: DC

## 2011-07-03 NOTE — Telephone Encounter (Signed)
We'll switch patient from 0.625 mg daily to estradiol 1 mg daily due to cost to the patient with same efficacy .

## 2011-07-03 NOTE — Telephone Encounter (Signed)
Pharmacy faxed over form asking if pt can switch to estradiol 1 mg 1po daily 90 day supply plus refills for a year, rather than premarin 0.625 mg tablet that she is taking now. The estradiol is much cheaper, please fill out form on paper chart where sign here sticker is located.

## 2011-11-02 ENCOUNTER — Other Ambulatory Visit: Payer: Self-pay | Admitting: Internal Medicine

## 2012-08-26 ENCOUNTER — Ambulatory Visit (HOSPITAL_COMMUNITY)
Admission: RE | Admit: 2012-08-26 | Discharge: 2012-08-26 | Disposition: A | Discharge status: Home / Self Care | Payer: 59 | Source: Ambulatory Visit | Attending: Internal Medicine | Admitting: Internal Medicine

## 2012-08-26 ENCOUNTER — Other Ambulatory Visit (HOSPITAL_COMMUNITY): Payer: Self-pay | Admitting: Internal Medicine

## 2012-08-26 DIAGNOSIS — R05 Cough: Secondary | ICD-10-CM | POA: Insufficient documentation

## 2012-08-26 DIAGNOSIS — Z0000 Encounter for general adult medical examination without abnormal findings: Secondary | ICD-10-CM

## 2012-08-26 DIAGNOSIS — R059 Cough, unspecified: Secondary | ICD-10-CM | POA: Insufficient documentation

## 2012-08-26 DIAGNOSIS — R509 Fever, unspecified: Secondary | ICD-10-CM | POA: Insufficient documentation

## 2012-09-01 ENCOUNTER — Ambulatory Visit (HOSPITAL_COMMUNITY): Payer: 59

## 2012-09-05 ENCOUNTER — Ambulatory Visit (HOSPITAL_COMMUNITY)
Admission: RE | Admit: 2012-09-05 | Discharge: 2012-09-05 | Disposition: A | Discharge status: Home / Self Care | Payer: 59 | Source: Ambulatory Visit | Attending: Internal Medicine | Admitting: Internal Medicine

## 2012-09-05 DIAGNOSIS — Z1231 Encounter for screening mammogram for malignant neoplasm of breast: Secondary | ICD-10-CM | POA: Insufficient documentation

## 2012-09-05 DIAGNOSIS — Z0000 Encounter for general adult medical examination without abnormal findings: Secondary | ICD-10-CM

## 2012-09-16 HISTORY — PX: BACK SURGERY: SHX140

## 2012-11-12 ENCOUNTER — Other Ambulatory Visit (HOSPITAL_COMMUNITY): Payer: Self-pay | Admitting: Family Medicine

## 2012-11-12 DIAGNOSIS — Z139 Encounter for screening, unspecified: Secondary | ICD-10-CM

## 2012-11-18 ENCOUNTER — Ambulatory Visit (HOSPITAL_COMMUNITY)
Admission: RE | Admit: 2012-11-18 | Discharge: 2012-11-18 | Disposition: A | Discharge status: Home / Self Care | Payer: 59 | Source: Ambulatory Visit | Attending: Family Medicine | Admitting: Family Medicine

## 2012-11-18 ENCOUNTER — Other Ambulatory Visit (HOSPITAL_COMMUNITY): Payer: 59

## 2012-11-18 DIAGNOSIS — Z13820 Encounter for screening for osteoporosis: Secondary | ICD-10-CM | POA: Insufficient documentation

## 2012-11-18 DIAGNOSIS — Z8262 Family history of osteoporosis: Secondary | ICD-10-CM | POA: Insufficient documentation

## 2012-11-18 DIAGNOSIS — Z139 Encounter for screening, unspecified: Secondary | ICD-10-CM

## 2012-12-24 ENCOUNTER — Ambulatory Visit: Payer: Self-pay | Admitting: Gynecology

## 2013-01-08 ENCOUNTER — Encounter: Payer: Self-pay | Admitting: Gynecology

## 2013-01-22 ENCOUNTER — Encounter: Payer: Self-pay | Admitting: Gynecology

## 2013-01-22 ENCOUNTER — Other Ambulatory Visit: Payer: Self-pay | Admitting: Gynecology

## 2013-01-22 ENCOUNTER — Other Ambulatory Visit (HOSPITAL_COMMUNITY)
Admission: RE | Admit: 2013-01-22 | Discharge: 2013-01-22 | Disposition: A | Discharge status: Home / Self Care | Payer: 59 | Source: Ambulatory Visit | Attending: Gynecology | Admitting: Gynecology

## 2013-01-22 ENCOUNTER — Ambulatory Visit (INDEPENDENT_AMBULATORY_CARE_PROVIDER_SITE_OTHER): Payer: 59 | Admitting: Gynecology

## 2013-01-22 VITALS — BP 156/90 | Ht 59.0 in | Wt 146.0 lb

## 2013-01-22 DIAGNOSIS — R39198 Other difficulties with micturition: Secondary | ICD-10-CM | POA: Insufficient documentation

## 2013-01-22 DIAGNOSIS — Z1151 Encounter for screening for human papillomavirus (HPV): Secondary | ICD-10-CM | POA: Insufficient documentation

## 2013-01-22 DIAGNOSIS — M810 Age-related osteoporosis without current pathological fracture: Secondary | ICD-10-CM | POA: Insufficient documentation

## 2013-01-22 DIAGNOSIS — N951 Menopausal and female climacteric states: Secondary | ICD-10-CM

## 2013-01-22 DIAGNOSIS — R6882 Decreased libido: Secondary | ICD-10-CM

## 2013-01-22 DIAGNOSIS — F411 Generalized anxiety disorder: Secondary | ICD-10-CM

## 2013-01-22 DIAGNOSIS — F419 Anxiety disorder, unspecified: Secondary | ICD-10-CM

## 2013-01-22 DIAGNOSIS — Z01419 Encounter for gynecological examination (general) (routine) without abnormal findings: Secondary | ICD-10-CM

## 2013-01-22 DIAGNOSIS — R3989 Other symptoms and signs involving the genitourinary system: Secondary | ICD-10-CM

## 2013-01-22 DIAGNOSIS — Z87410 Personal history of cervical dysplasia: Secondary | ICD-10-CM | POA: Insufficient documentation

## 2013-01-22 MED ORDER — ALPRAZOLAM 0.25 MG PO TABS: 0.2500 mg | ORAL_TABLET | Freq: Every evening | ORAL | Status: DC | PRN

## 2013-01-22 NOTE — Progress Notes (Addendum)
ICYSS SKOG 04/18/1966 [id number]   History:    47 y.o.  for annual gyn exam who presented to the office today complaining of severe vasomotor symptoms hot flashes irritability mood swings and decreased libido. Review of her record indicated that the last time she was seen was in November of 2012 with similar complaints and had an FSH that was found to be elevated with a value of 62. Patient with prior history of LAVH/RSO in 2003. Patient was put on Premarin 0.625 mg daily but discontinued it shortly thereafter because of cost and was placed on estradiol 1 mg daily but stated that cause her to have vaginal dryness and discontinued it. She is currently on a home replacement therapy.  Patient had an L4-S1 laminectomy and fusion by Dr. Sharolyn Douglas neurosurgeon. She stated that he had done a bone density study and was found to be osteoporotic whereby her dual fever T score was -2.8 and she was started on Fosamax 70 mg q. Weekly. She was very anxious today and complaining of hot flashes.  Patient last year had complained of difficulty voiding sometimes she doesn't even have the sensation to void. She states she voiced 3 times a day. She denied any dysuria. She had been referred to urologist Dr. Brunilda Payor last year and was placed on Rapaflo  8 mg daily at bedtime but stated that they do not help her symptoms and she did not return to see him.  Patient stated that she was diagnosed with macular degeneration recently.  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 Patient's father had history of colon cancer and has been over 5 years this patient had her last colonoscopy. Past medical history,surgical history, family history and social history were all reviewed and documented in the EPIC chart.  Gynecologic History No LMP recorded. Patient has had a hysterectomy. Contraception: status post  hysterectomy Last Pap: 2012. Results were: normal Last mammogram: 2014. Results were: normal  Obstetric History OB History   Grav Para Term Preterm Abortions TAB SAB Ect Mult Living   2 2 2       2      # Outc Date GA Lbr Len/2nd Wgt Sex Del Anes PTL Lv   1 TRM     F SVD   Yes   2 TRM     M SVD   Yes       ROS: A ROS was performed and pertinent positives and negatives are included in the history.  GENERAL: No fevers or chills. HEENT: macular degeneration CARDIOVASCULAR: No chest pain or pressure. No palpitations. PULMONARY: No shortness of breath, cough or wheeze. GASTROINTESTINAL: No abdominal pain, nausea, vomiting or diarrhea, melena or bright red blood per rectum. GENITOURINARY: difficulty voiding MUSCULOSKELETAL: No joint or muscle pain, no back pain, no recent trauma. DERMATOLOGIC: No rash, no itching, no lesions. ENDOCRINE: No polyuria, polydipsia, no heat or cold intolerance. No recent change in weight. HEMATOLOGICAL: No anemia or easy bruising or bleeding. NEUROLOGIC: No headache, seizures, numbness, tingling or weakness. PSYCHIATRIC: No depression, no loss of interest in normal activity or change in sleep pattern.   Hot flashes, mood swings, night sweats, irritability, anxiety, vaginal dryness, decreased libido Exam: chaperone present  BP 156/90  Ht 4\' 11"  (1.499 m)  Wt 146 lb (66.225 kg)  BMI 29.47 kg/m2  Body mass index is 29.47 kg/(m^2).  General appearance : Well developed  well nourished female. No acute distress HEENT: Neck supple, trachea midline, no carotid bruits, no thyroidmegaly Lungs: Clear to auscultation, no rhonchi or wheezes, or rib retractions  Heart: Regular rate and rhythm, no murmurs or gallops Breast:Examined in sitting and supine position were symmetrical in appearance, no palpable masses or tenderness,  no skin retraction, no nipple inversion, no nipple discharge, no skin discoloration, no axillary or supraclavicular lymphadenopathy Abdomen: no  palpable masses or tenderness, no rebound or guarding Extremities: no edema or skin discoloration or tenderness  Pelvic:  Bartholin, Urethra, Skene Glands: Within normal limits             Vagina: No gross lesions or discharge  Cervix:absent  Uterus absent  Adnexa  Without masses or tenderness  Anus and perineum  normal   Rectovaginal  normal sphincter tone without palpated masses or tenderness             Hemoccult cards were provided     Assessment/Plan:  47 y.o. female for annual exam with severe vasomotor symptoms of the menopause. She is going to be placed on a combination estrogen with testosterone to help with all her symptoms including her decreased libido. She will be placed on Triest 1.25/methyltestosterone 1.25 milligrams daily. The risks benefits and pros and cons of hormone replacement therapy to include DVT of breast cancer discussed. The women's health initiative study was discussed as well. Patient fully understands and accepts. Patient will have a bone density study here in our office next year to see the response to her treatment for her osteoporosis with Fosamax that was started this year. We discussed the importance of calcium 1200 mg daily and vitamin D 2000 units daily. We discussed importance of regular exercise as well. Because of her past history of CIN-3 back in the year 2000 will continue to do yearly Pap smears. She will be referred to the urologist for further evaluation of her voiding difficulty. She was reminded also on the importance of monthly self breast examination.a TSH will be drawn today as well. For her anxiety she was prescribed Xanax 0.25 mg to take 1 by mouth daily as needed.    Ok Edwards MD, 4:39 PM 01/22/2013

## 2013-01-22 NOTE — Patient Instructions (Addendum)
Osteoporosis Throughout your life, your body breaks down old bone and replaces it with new bone. As you get older, your body does not replace bone as quickly as it breaks it down. By the age of 30 years, most people begin to gradually lose bone because of the imbalance between bone loss and replacement. Some people lose more bone than others. Bone loss beyond a specified normal degree is considered osteoporosis.  Osteoporosis affects the strength and durability of your bones. The inside of the ends of your bones and your flat bones, like the bones of your pelvis, look like honeycomb, filled with tiny open spaces. As bone loss occurs, your bones become less dense. This means that the open spaces inside your bones become bigger and the walls between these spaces become thinner. This makes your bones weaker. Bones of a person with osteoporosis can become so weak that they can break (fracture) during minor accidents, such as a simple fall. CAUSES  The following factors have been associated with the development of osteoporosis:  Smoking.  Drinking more than 2 alcoholic drinks several days per week.  Long-term use of certain medicines:  Corticosteroids.  Chemotherapy medicines.  Thyroid medicines.  Antiepileptic medicines.  Gonadal hormone suppression medicine.  Immunosuppression medicine.  Being underweight.  Lack of physical activity.  Lack of exposure to the sun. This can lead to vitamin D deficiency.  Certain medical conditions:  Certain inflammatory bowel diseases, such as Crohn's disease and ulcerative colitis.  Diabetes.  Hyperthyroidism.  Hyperparathyroidism. RISK FACTORS Anyone can develop osteoporosis. However, the following factors can increase your risk of developing osteoporosis:  Gender Women are at higher risk than men.  Age Being older than 50 years increases your risk.  Ethnicity White and Asian people have an increased risk.  Weight Being extremely  underweight can increase your risk of osteoporosis.  Family history of osteoporosis Having a family member who has developed osteoporosis can increase your risk. SYMPTOMS  Usually, people with osteoporosis have no symptoms.  DIAGNOSIS  Signs during a physical exam that may prompt your caregiver to suspect osteoporosis include:  Decreased height. This is usually caused by the compression of the bones that form your spine (vertebrae) because they have weakened and become fractured.  A curving or rounding of the upper back (kyphosis). To confirm signs of osteoporosis, your caregiver may request a procedure that uses 2 low-dose X-ray beams with different levels of energy to measure your bone mineral density (dual-energy X-ray absorptiometry [DXA]). Also, your caregiver may check your level of vitamin D. TREATMENT  The goal of osteoporosis treatment is to strengthen bones in order to decrease the risk of bone fractures. There are different types of medicines available to help achieve this goal. Some of these medicines work by slowing the processes of bone loss. Some medicines work by increasing bone density. Treatment also involves making sure that your levels of calcium and vitamin D are adequate. PREVENTION  There are things you can do to help prevent osteoporosis. Adequate intake of calcium and vitamin D can help you achieve optimal bone mineral density. Regular exercise can also help, especially resistance and weight-bearing activities. If you smoke, quitting smoking is an important part of osteoporosis prevention. MAKE SURE YOU:  Understand these instructions.  Will watch your condition.  Will get help right away if you are not doing well or get worse. Document Released: 04/25/2005 Document Revised: 07/02/2012 Document Reviewed: 06/30/2011 [employer] Patient Information 2014 [employer].  

## 2013-01-22 NOTE — Addendum Note (Signed)
Addended by: Ok Edwards on: 01/22/2013 05:05 PM   Modules accepted: Orders

## 2013-01-23 ENCOUNTER — Telehealth: Payer: Self-pay | Admitting: *Deleted

## 2013-01-23 LAB — TSH: TSH: 0.859 u[IU]/mL (ref 0.350–4.500)

## 2013-01-23 MED ORDER — NONFORMULARY OR COMPOUNDED ITEM: Status: DC

## 2013-01-23 NOTE — Telephone Encounter (Signed)
Message copied by Aura Camps on Fri Jan 23, 2013 10:34 AM ------      Message from: Ok Edwards      Created: Thu Jan 22, 2013  5:01 PM       Victorino Dike, please make appointment for this patient to see Dr. Patsi Sears for this patient with difficulty in voiding. She had seen Dr. Catha Nottingham in the past but would like to see someone different for a second opinion. Please send him my office notes. ------

## 2013-01-23 NOTE — Telephone Encounter (Signed)
Monica [name] said pt can call and schedule appointment I called and left message on pt voicemail that she can make appointment herself. I faxed notes to [facility].

## 2013-01-23 NOTE — Telephone Encounter (Signed)
Spoke with [facility] about the below note and appointment desk said she would call me back because she has to send the below to a nurse.

## 2013-01-26 LAB — PTH, INTACT AND CALCIUM: PTH: 48.4 pg/mL (ref 14.0–72.0)

## 2013-01-26 NOTE — Telephone Encounter (Signed)
APPOINTMENT ON 02/26/13

## 2013-02-09 ENCOUNTER — Encounter: Payer: Self-pay | Admitting: Gynecology

## 2013-05-22 ENCOUNTER — Other Ambulatory Visit: Payer: Self-pay | Admitting: Gynecology

## 2013-05-25 NOTE — Telephone Encounter (Signed)
rx called kw

## 2013-06-27 ENCOUNTER — Other Ambulatory Visit: Payer: Self-pay | Admitting: Internal Medicine

## 2013-06-29 ENCOUNTER — Other Ambulatory Visit: Payer: Self-pay | Admitting: Internal Medicine

## 2013-09-03 ENCOUNTER — Other Ambulatory Visit: Payer: Self-pay | Admitting: Gynecology

## 2013-11-30 ENCOUNTER — Other Ambulatory Visit: Payer: Self-pay | Admitting: Gynecology

## 2013-11-30 NOTE — Telephone Encounter (Signed)
Patient informed. 

## 2013-11-30 NOTE — Telephone Encounter (Signed)
Needs annual exam in June 

## 2014-02-23 ENCOUNTER — Other Ambulatory Visit: Payer: Self-pay | Admitting: Gynecology

## 2014-04-16 ENCOUNTER — Other Ambulatory Visit: Payer: Self-pay

## 2014-05-13 ENCOUNTER — Encounter: Payer: 59 | Admitting: Gynecology

## 2014-05-18 ENCOUNTER — Ambulatory Visit (INDEPENDENT_AMBULATORY_CARE_PROVIDER_SITE_OTHER): Payer: 59 | Admitting: Gynecology

## 2014-05-18 ENCOUNTER — Encounter: Payer: Self-pay | Admitting: Gynecology

## 2014-05-18 ENCOUNTER — Other Ambulatory Visit (HOSPITAL_COMMUNITY)
Admission: RE | Admit: 2014-05-18 | Discharge: 2014-05-18 | Disposition: A | Discharge status: Home / Self Care | Payer: 59 | Source: Ambulatory Visit | Attending: Gynecology | Admitting: Gynecology

## 2014-05-18 VITALS — BP 132/88 | Ht 59.0 in | Wt 148.0 lb

## 2014-05-18 DIAGNOSIS — Z01419 Encounter for gynecological examination (general) (routine) without abnormal findings: Secondary | ICD-10-CM | POA: Insufficient documentation

## 2014-05-18 DIAGNOSIS — M810 Age-related osteoporosis without current pathological fracture: Secondary | ICD-10-CM

## 2014-05-18 DIAGNOSIS — Z79890 Hormone replacement therapy (postmenopausal): Secondary | ICD-10-CM

## 2014-05-18 DIAGNOSIS — Z23 Encounter for immunization: Secondary | ICD-10-CM

## 2014-05-18 DIAGNOSIS — Z780 Asymptomatic menopausal state: Secondary | ICD-10-CM

## 2014-05-18 MED ORDER — NONFORMULARY OR COMPOUNDED ITEM: 1.2500 mg | Freq: Every day | Status: DC

## 2014-05-18 NOTE — Patient Instructions (Signed)

## 2014-05-18 NOTE — Progress Notes (Signed)
Monica ShinerDeanna D [name] 1966-07-27 [id number]   History:    48 y.o.  for annual gyn exam with no complaints today.Patient with prior history of LAVH/RSO in 2003. Patient was put on Premarin 0.625 mg daily but discontinued it shortly thereafter because of cost and was placed on estradiol 1 mg daily but stated that cause her to have vaginal dryness and discontinued it. She was then started on Estratest 1.25 mg which she daily.Patient had an L4-S1 laminectomy and fusion by Dr. Sharolyn DouglasMax Cohen neurosurgeon. She stated that he had done a bone density study and was found to be osteoporotic whereby her dual femur T score was -2.8 and she was started on Fosamax 70 mg q. Weekly. She had 3 different fractures within one year by her left metatarsal foot and left middle finger. The orthopedic surgeon referred her [facility] and because of her osteoporosis started her on Forteo daily which was started early this year. She is taking her calcium and vitamin D daily.  Patient recently was diagnosed with macular degeneration. Review of her past dysplasia history demonstrated the following: Before her hysterectomy in the year 2000 patient had CIN-3 with positive ECC but the specimen from her LAVH RSO demonstrated no cervical dysplasia. Patient's father had history of colon cancer she had a normal colonoscopy in 2011.    Past medical history,surgical history, family history and social history were all reviewed and documented in the EPIC chart.  Gynecologic History No LMP recorded. Patient has had a hysterectomy. Contraception: status post hysterectomy Last Pap: 2014. Results were: normal Last mammogram: 2014. Results were: normal  Obstetric History OB History  Gravida Para Term Preterm AB SAB TAB Ectopic Multiple Living  2 2 2       2     # Outcome Date GA Lbr Len/2nd Weight Sex Delivery Anes PTL Lv  2 TRM     M SVD   Y  1 TRM     F SVD   Y       ROS: A ROS was performed and pertinent  positives and negatives are included in the history.  GENERAL: No fevers or chills. HEENT: No change in vision, no earache, sore throat or sinus congestion. NECK: No pain or stiffness. CARDIOVASCULAR: No chest pain or pressure. No palpitations. PULMONARY: No shortness of breath, cough or wheeze. GASTROINTESTINAL: No abdominal pain, nausea, vomiting or diarrhea, melena or bright red blood per rectum. GENITOURINARY: No urinary frequency, urgency, hesitancy or dysuria. MUSCULOSKELETAL: No joint or muscle pain, no back pain, no recent trauma. DERMATOLOGIC: No rash, no itching, no lesions. ENDOCRINE: No polyuria, polydipsia, no heat or cold intolerance. No recent change in weight. HEMATOLOGICAL: No anemia or easy bruising or bleeding. NEUROLOGIC: No headache, seizures, numbness, tingling or weakness. PSYCHIATRIC: No depression, no loss of interest in normal activity or change in sleep pattern.     Exam: chaperone present  BP 132/88  Ht 4\' 11"  (1.499 m)  Wt 148 lb (67.132 kg)  BMI 29.88 kg/m2  Body mass index is 29.88 kg/(m^2).  General appearance : Well developed well nourished female. No acute distress HEENT: Neck supple, trachea midline, no carotid bruits, no thyroidmegaly Lungs: Clear to auscultation, no rhonchi or wheezes, or rib retractions  Heart: Regular rate and rhythm, no murmurs or gallops Breast:Examined in sitting and supine position were symmetrical in appearance, no palpable masses or tenderness,  no skin retraction, no nipple inversion, no nipple discharge, no skin discoloration, no axillary or  supraclavicular lymphadenopathy Abdomen: no palpable masses or tenderness, no rebound or guarding Extremities: no edema or skin discoloration or tenderness  Pelvic:  Bartholin, Urethra, Skene Glands: Within normal limits             Vagina: No gross lesions or discharge  Cervix: Absent  Uterus  Absent  Adnexa  Without masses or tenderness  Anus and perineum  normal   Rectovaginal  normal  sphincter tone without palpated masses or tenderness             Hemoccult cards to be provided     Assessment/Plan:  48 y.o. female for annual exam doing well Estratest for vasomotor symptoms. Pap smear done today. She received that the Tdap vaccine today. She was reminded to submit to the office the fecal Hemoccult cards testing. Patient's PCP we'll be doing her blood work. She will followup density study [facility].   Ok EdwardsFERNANDEZ,JUAN H MD, 3:18 PM 05/18/2014

## 2014-05-20 LAB — CYTOLOGY - PAP

## 2014-05-31 ENCOUNTER — Encounter: Payer: Self-pay | Admitting: Gynecology

## 2014-06-29 ENCOUNTER — Other Ambulatory Visit: Payer: 59 | Admitting: Anesthesiology

## 2014-06-29 DIAGNOSIS — Z1211 Encounter for screening for malignant neoplasm of colon: Secondary | ICD-10-CM

## 2014-12-23 ENCOUNTER — Other Ambulatory Visit: Payer: Self-pay | Admitting: *Deleted

## 2014-12-23 MED ORDER — NONFORMULARY OR COMPOUNDED ITEM: 1.2500 mg | Freq: Every day | Status: DC

## 2015-08-02 ENCOUNTER — Other Ambulatory Visit: Payer: Self-pay

## 2015-09-12 ENCOUNTER — Other Ambulatory Visit: Payer: Self-pay

## 2015-09-12 MED ORDER — NONFORMULARY OR COMPOUNDED ITEM: 1.2500 mg | Freq: Every day | Status: DC

## 2015-09-21 ENCOUNTER — Encounter: Payer: Self-pay | Admitting: Gynecology

## 2015-10-11 ENCOUNTER — Encounter: Payer: Self-pay | Admitting: Gynecology

## 2015-12-09 ENCOUNTER — Encounter: Payer: Self-pay | Admitting: Gynecology

## 2015-12-09 ENCOUNTER — Ambulatory Visit (INDEPENDENT_AMBULATORY_CARE_PROVIDER_SITE_OTHER): Payer: 59 | Admitting: Gynecology

## 2015-12-09 VITALS — BP 138/84 | Ht 59.0 in | Wt 149.0 lb

## 2015-12-09 DIAGNOSIS — M810 Age-related osteoporosis without current pathological fracture: Secondary | ICD-10-CM

## 2015-12-09 DIAGNOSIS — Z01419 Encounter for gynecological examination (general) (routine) without abnormal findings: Secondary | ICD-10-CM | POA: Diagnosis not present

## 2015-12-09 DIAGNOSIS — Z79890 Hormone replacement therapy (postmenopausal): Secondary | ICD-10-CM | POA: Diagnosis not present

## 2015-12-09 NOTE — Progress Notes (Signed)
Monica [name] [birth year] [id number]   History:    50 y.o.  for annual gyn exam with no complaints. Patient had an L4-S1 laminectomy and fusion by Dr. Sharolyn Douglas neurosurgeon. She stated that he had done a bone density study and was found to be osteoporotic whereby her dual femur T score was -2.8 and she was started on Fosamax 70 mg q. Weekly. She had 3 different fractures within one year by her left metatarsal foot and left middle finger. The orthopedic surgeon referred her to a bone specialist [facility] and because of her osteoporosis started her on Forteo daily which she has been on for a urine half.. She is taking her calcium and vitamin D daily.Patient with prior history of LAVH/RSO in 2003. She is currently on Estrace 1 mg by mouth daily.Patient recently was diagnosed with macular degeneration. Review of her past dysplasia history demonstrated the following: Before her hysterectomy in the year 2000 patient had CIN-3 with positive ECC but the specimen from her LAVH RSO demonstrated no cervical dysplasia. Patient's father had history of colon cancer she had a normal colonoscopy in 2011.   Past medical history,surgical history, family history and social history were all reviewed and documented in the EPIC chart.  Gynecologic History No LMP recorded. Patient has had a hysterectomy. Contraception: post menopausal status Last Pap: See above. Results were: See above Last mammogram: 2014. Results were: normal  Obstetric History OB History  Gravida Para Term Preterm AB SAB TAB Ectopic Multiple Living  # Outcome Date GA Lbr Len/2nd Weight Sex Delivery Anes PTL Lv  2 Term     M Vag-Spont   Y  1 Term     F Vag-Spont   Y       ROS: A ROS was performed and pertinent positives and negatives are included in the history.  GENERAL: No fevers or chills. HEENT: No change in vision, no earache, sore throat or sinus congestion. NECK: No pain or stiffness. CARDIOVASCULAR: No  chest pain or pressure. No palpitations. PULMONARY: No shortness of breath, cough or wheeze. GASTROINTESTINAL: No abdominal pain, nausea, vomiting or diarrhea, melena or bright red blood per rectum. GENITOURINARY: No urinary frequency, urgency, hesitancy or dysuria. MUSCULOSKELETAL: No joint or muscle pain, no back pain, no recent trauma. DERMATOLOGIC: No rash, no itching, no lesions. ENDOCRINE: No polyuria, polydipsia, no heat or cold intolerance. No recent change in weight. HEMATOLOGICAL: No anemia or easy bruising or bleeding. NEUROLOGIC: No headache, seizures, numbness, tingling or weakness. PSYCHIATRIC: No depression, no loss of interest in normal activity or change in sleep pattern.     Exam: chaperone present  BP 138/84 mmHg  Ht  (1.499 m)  Wt 149 lb (67.586 kg)  BMI 30.08 kg/m2  Body mass index is 30.08 kg/(m^2).  General appearance : Well developed well nourished female. No acute distress HEENT: Eyes: no retinal hemorrhage or exudates,  Neck supple, trachea midline, no carotid bruits, no thyroidmegaly Lungs: Clear to auscultation, no rhonchi or wheezes, or rib retractions  Heart: Regular rate and rhythm, no murmurs or gallops Breast:Examined in sitting and supine position were symmetrical in appearance, no palpable masses or tenderness,  no skin retraction, no nipple inversion, no nipple discharge, no skin discoloration, no axillary or supraclavicular lymphadenopathy Abdomen: no palpable masses or tenderness, no rebound or guarding Extremities: no edema or skin discoloration or tenderness  Pelvic:  Bartholin, Urethra, Skene Glands:  Within normal limits             Vagina: No gross lesions or discharge  Cervix: Absent  Uterus  absent  Adnexa  Without masses or tenderness  Anus and perineum  normal   Rectovaginal  normal sphincter tone without palpated masses or tenderness             Hemoccult will provide     Assessment/Plan:  50 y.o. female for annual exam with  history of osteoporosis on Forteo daily injections [facility]. Patient has not had a bone density study since 2014 will schedule one along with her mammogram at [facility]. She will return to the office next week for the following screening blood work: Comprehensive metabolic panel, fasting lipid profile, TSH, CBC, and urinalysis. She was instructed to provide her fecal occult blood testing envelope when she returns to the office next week. She should continue her calcium and vitamin D on a regular basis. We'll also be checking her vitamin D level as well. Pap smear not indicated according to new guidelines.   Ok EdwardsFERNANDEZ,Reshaun Briseno H MD, 4:33 PM 12/09/2015

## 2015-12-09 NOTE — Patient Instructions (Signed)

## 2015-12-10 LAB — URINALYSIS W MICROSCOPIC + REFLEX CULTURE
BACTERIA UA: NONE SEEN [HPF]
Bilirubin Urine: NEGATIVE
CASTS: NONE SEEN [LPF]
Crystals: NONE SEEN [HPF]
GLUCOSE, UA: NEGATIVE
Hgb urine dipstick: NEGATIVE
Ketones, ur: NEGATIVE
Nitrite: NEGATIVE
PROTEIN: NEGATIVE
RBC / HPF: NONE SEEN RBC/HPF (ref <=2)
Specific Gravity, Urine: 1.007 (ref 1.001–1.035)
YEAST: NONE SEEN [HPF]
pH: 6.5 (ref 5.0–8.0)

## 2015-12-12 LAB — URINE CULTURE: Colony Count: 25000

## 2015-12-16 ENCOUNTER — Other Ambulatory Visit: Payer: Self-pay | Admitting: Gynecology

## 2015-12-16 MED ORDER — NITROFURANTOIN MONOHYD MACRO 100 MG PO CAPS: 100.0000 mg | ORAL_CAPSULE | Freq: Two times a day (BID) | ORAL | Status: DC

## 2015-12-23 ENCOUNTER — Other Ambulatory Visit: Payer: 59

## 2015-12-23 LAB — COMPREHENSIVE METABOLIC PANEL
ALBUMIN: 4.2 g/dL (ref 3.6–5.1)
ALT: 29 U/L (ref 6–29)
AST: 22 U/L (ref 10–35)
Alkaline Phosphatase: 89 U/L (ref 33–130)
BUN: 9 mg/dL (ref 7–25)
CALCIUM: 9.6 mg/dL (ref 8.6–10.4)
CHLORIDE: 100 mmol/L (ref 98–110)
CO2: 28 mmol/L (ref 20–31)
Creat: 0.84 mg/dL (ref 0.50–1.05)
Glucose, Bld: 84 mg/dL (ref 65–99)
POTASSIUM: 4.4 mmol/L (ref 3.5–5.3)
Sodium: 138 mmol/L (ref 135–146)
TOTAL PROTEIN: 6.8 g/dL (ref 6.1–8.1)
Total Bilirubin: 0.2 mg/dL (ref 0.2–1.2)

## 2015-12-23 LAB — LIPID PANEL
CHOL/HDL RATIO: 7.4 ratio — AB (ref <=5.0)
CHOLESTEROL: 251 mg/dL — AB (ref 125–200)
HDL: 34 mg/dL — AB (ref >=46)
TRIGLYCERIDES: 564 mg/dL — AB (ref <150)

## 2015-12-23 LAB — CBC WITH DIFFERENTIAL/PLATELET
BASOS ABS: 0 {cells}/uL (ref 0–200)
Basophils Relative: 0 %
EOS ABS: 600 {cells}/uL — AB (ref 15–500)
Eosinophils Relative: 8 %
HCT: 42.2 % (ref 35.0–45.0)
Hemoglobin: 14.3 g/dL (ref 11.7–15.5)
LYMPHS ABS: 2100 {cells}/uL (ref 850–3900)
Lymphocytes Relative: 28 %
MCH: 30.9 pg (ref 27.0–33.0)
MCHC: 33.9 g/dL (ref 32.0–36.0)
MCV: 91.1 fL (ref 80.0–100.0)
MONOS PCT: 6 %
MPV: 9.2 fL (ref 7.5–12.5)
Monocytes Absolute: 450 cells/uL (ref 200–950)
Neutro Abs: 4350 cells/uL (ref 1500–7800)
Neutrophils Relative %: 58 %
PLATELETS: 333 10*3/uL (ref 140–400)
RBC: 4.63 MIL/uL (ref 3.80–5.10)
RDW: 13.1 % (ref 11.0–15.0)
WBC: 7.5 10*3/uL (ref 3.8–10.8)

## 2015-12-23 LAB — TSH: TSH: 1.67 mIU/L

## 2015-12-24 LAB — VITAMIN D 25 HYDROXY (VIT D DEFICIENCY, FRACTURES): Vit D, 25-Hydroxy: 22 ng/mL — ABNORMAL LOW (ref 30–100)

## 2015-12-27 ENCOUNTER — Encounter: Payer: Self-pay | Admitting: Gynecology

## 2015-12-28 ENCOUNTER — Encounter: Payer: Self-pay | Admitting: Gynecology

## 2015-12-31 ENCOUNTER — Encounter: Payer: Self-pay | Admitting: Gynecology

## 2016-01-02 ENCOUNTER — Telehealth: Payer: Self-pay | Admitting: *Deleted

## 2016-01-02 MED ORDER — VITAMIN D (ERGOCALCIFEROL) 1.25 MG (50000 UNIT) PO CAPS: ORAL_CAPSULE | ORAL | Status: DC

## 2016-01-02 NOTE — Telephone Encounter (Signed)
Per result note on 12/23/15 "Vitamin D 50,000 units and have patient take one tablet weekly for 12 weeks." Rx was never sent to pharmacy. Rx sent pt aware

## 2016-01-03 ENCOUNTER — Other Ambulatory Visit: Payer: Self-pay | Admitting: Anesthesiology

## 2016-01-03 DIAGNOSIS — Z1211 Encounter for screening for malignant neoplasm of colon: Secondary | ICD-10-CM

## 2016-01-19 ENCOUNTER — Other Ambulatory Visit: Payer: Self-pay

## 2016-01-19 MED ORDER — NONFORMULARY OR COMPOUNDED ITEM: 1.2500 mg | Freq: Every day | Status: DC

## 2016-01-19 NOTE — Telephone Encounter (Signed)
Called in to [facility].

## 2016-01-19 NOTE — Addendum Note (Signed)
Addended by: Keenan BachelorANNAS, Novie Maggio R on: 01/19/2016 03:39 PM   Modules accepted: Orders

## 2016-07-25 ENCOUNTER — Other Ambulatory Visit: Payer: Self-pay

## 2016-07-25 MED ORDER — NONFORMULARY OR COMPOUNDED ITEM: 1.2500 mg | Freq: Every day | 4 refills | Status: DC

## 2016-07-25 NOTE — Telephone Encounter (Signed)
Refill Called in for #90 with one refill since it is controlled substance and can only Rx 6 mos at a time.

## 2016-08-02 DIAGNOSIS — E782 Mixed hyperlipidemia: Secondary | ICD-10-CM | POA: Diagnosis not present

## 2016-08-02 DIAGNOSIS — M810 Age-related osteoporosis without current pathological fracture: Secondary | ICD-10-CM | POA: Diagnosis not present

## 2016-08-02 DIAGNOSIS — I10 Essential (primary) hypertension: Secondary | ICD-10-CM | POA: Diagnosis not present

## 2016-11-28 ENCOUNTER — Other Ambulatory Visit (HOSPITAL_COMMUNITY): Payer: Self-pay | Admitting: Internal Medicine

## 2016-11-28 DIAGNOSIS — E2839 Other primary ovarian failure: Secondary | ICD-10-CM

## 2016-11-28 DIAGNOSIS — Z1231 Encounter for screening mammogram for malignant neoplasm of breast: Secondary | ICD-10-CM

## 2016-12-12 ENCOUNTER — Encounter: Payer: Self-pay | Admitting: Gynecology

## 2016-12-27 ENCOUNTER — Ambulatory Visit (HOSPITAL_COMMUNITY)
Admission: RE | Admit: 2016-12-27 | Discharge: 2016-12-27 | Disposition: A | Discharge status: Home / Self Care | Payer: 59 | Source: Ambulatory Visit | Attending: Internal Medicine | Admitting: Internal Medicine

## 2016-12-27 DIAGNOSIS — M85852 Other specified disorders of bone density and structure, left thigh: Secondary | ICD-10-CM | POA: Diagnosis not present

## 2016-12-27 DIAGNOSIS — M85832 Other specified disorders of bone density and structure, left forearm: Secondary | ICD-10-CM | POA: Diagnosis not present

## 2016-12-27 DIAGNOSIS — Z1231 Encounter for screening mammogram for malignant neoplasm of breast: Secondary | ICD-10-CM | POA: Diagnosis not present

## 2016-12-27 DIAGNOSIS — E2839 Other primary ovarian failure: Secondary | ICD-10-CM | POA: Insufficient documentation

## 2017-01-09 ENCOUNTER — Ambulatory Visit (HOSPITAL_COMMUNITY): Payer: Self-pay

## 2017-01-09 ENCOUNTER — Other Ambulatory Visit (HOSPITAL_COMMUNITY): Payer: Self-pay

## 2017-02-21 ENCOUNTER — Other Ambulatory Visit: Payer: Self-pay

## 2017-06-28 DIAGNOSIS — E782 Mixed hyperlipidemia: Secondary | ICD-10-CM | POA: Diagnosis not present

## 2017-06-28 DIAGNOSIS — I10 Essential (primary) hypertension: Secondary | ICD-10-CM | POA: Diagnosis not present

## 2017-10-02 DIAGNOSIS — M545 Low back pain: Secondary | ICD-10-CM | POA: Diagnosis not present

## 2017-10-02 DIAGNOSIS — M5416 Radiculopathy, lumbar region: Secondary | ICD-10-CM | POA: Diagnosis not present

## 2017-10-02 DIAGNOSIS — M546 Pain in thoracic spine: Secondary | ICD-10-CM | POA: Diagnosis not present

## 2018-08-08 DIAGNOSIS — R000 Tachycardia, unspecified: Secondary | ICD-10-CM | POA: Diagnosis not present

## 2018-08-08 DIAGNOSIS — K219 Gastro-esophageal reflux disease without esophagitis: Secondary | ICD-10-CM | POA: Diagnosis not present

## 2018-08-08 DIAGNOSIS — Z1389 Encounter for screening for other disorder: Secondary | ICD-10-CM | POA: Diagnosis not present

## 2018-08-08 DIAGNOSIS — Z0001 Encounter for general adult medical examination with abnormal findings: Secondary | ICD-10-CM | POA: Diagnosis not present

## 2018-08-08 DIAGNOSIS — E748 Other specified disorders of carbohydrate metabolism: Secondary | ICD-10-CM | POA: Diagnosis not present

## 2020-07-25 ENCOUNTER — Encounter (HOSPITAL_COMMUNITY): Payer: Self-pay

## 2020-07-25 ENCOUNTER — Observation Stay (HOSPITAL_COMMUNITY): Payer: 59

## 2020-07-25 ENCOUNTER — Observation Stay (HOSPITAL_COMMUNITY)
Admit: 2020-07-25 | Discharge: 2020-07-25 | Disposition: A | Discharge status: Home / Self Care | Payer: 59 | Attending: Internal Medicine | Admitting: Internal Medicine

## 2020-07-25 ENCOUNTER — Observation Stay (HOSPITAL_COMMUNITY)
Admission: EM | Admit: 2020-07-25 | Discharge: 2020-07-25 | Disposition: A | Discharge status: Home / Self Care | Payer: 59 | Attending: Internal Medicine | Admitting: Internal Medicine

## 2020-07-25 ENCOUNTER — Observation Stay (HOSPITAL_BASED_OUTPATIENT_CLINIC_OR_DEPARTMENT_OTHER): Payer: 59

## 2020-07-25 ENCOUNTER — Emergency Department (HOSPITAL_COMMUNITY): Payer: 59

## 2020-07-25 ENCOUNTER — Other Ambulatory Visit: Payer: Self-pay

## 2020-07-25 DIAGNOSIS — T481X5A Adverse effect of skeletal muscle relaxants [neuromuscular blocking agents], initial encounter: Secondary | ICD-10-CM | POA: Insufficient documentation

## 2020-07-25 DIAGNOSIS — Y638 Failure in dosage during other surgical and medical care: Secondary | ICD-10-CM | POA: Insufficient documentation

## 2020-07-25 DIAGNOSIS — Z7982 Long term (current) use of aspirin: Secondary | ICD-10-CM | POA: Insufficient documentation

## 2020-07-25 DIAGNOSIS — E876 Hypokalemia: Secondary | ICD-10-CM | POA: Diagnosis present

## 2020-07-25 DIAGNOSIS — I10 Essential (primary) hypertension: Secondary | ICD-10-CM | POA: Diagnosis not present

## 2020-07-25 DIAGNOSIS — R200 Anesthesia of skin: Principal | ICD-10-CM | POA: Insufficient documentation

## 2020-07-25 DIAGNOSIS — E7800 Pure hypercholesterolemia, unspecified: Secondary | ICD-10-CM | POA: Diagnosis not present

## 2020-07-25 DIAGNOSIS — R202 Paresthesia of skin: Secondary | ICD-10-CM | POA: Diagnosis not present

## 2020-07-25 DIAGNOSIS — Z79899 Other long term (current) drug therapy: Secondary | ICD-10-CM | POA: Diagnosis not present

## 2020-07-25 DIAGNOSIS — R519 Headache, unspecified: Secondary | ICD-10-CM | POA: Diagnosis not present

## 2020-07-25 DIAGNOSIS — K219 Gastro-esophageal reflux disease without esophagitis: Secondary | ICD-10-CM | POA: Diagnosis present

## 2020-07-25 DIAGNOSIS — Z87891 Personal history of nicotine dependence: Secondary | ICD-10-CM | POA: Diagnosis not present

## 2020-07-25 DIAGNOSIS — R739 Hyperglycemia, unspecified: Secondary | ICD-10-CM | POA: Diagnosis present

## 2020-07-25 DIAGNOSIS — G459 Transient cerebral ischemic attack, unspecified: Secondary | ICD-10-CM | POA: Diagnosis not present

## 2020-07-25 DIAGNOSIS — R55 Syncope and collapse: Secondary | ICD-10-CM | POA: Insufficient documentation

## 2020-07-25 DIAGNOSIS — Z720 Tobacco use: Secondary | ICD-10-CM | POA: Diagnosis present

## 2020-07-25 LAB — PROTIME-INR
INR: 1.1 (ref 0.8–1.2)
Prothrombin Time: 13.5 seconds (ref 11.4–15.2)

## 2020-07-25 LAB — COMPREHENSIVE METABOLIC PANEL
ALT: 21 U/L (ref 0–44)
AST: 25 U/L (ref 15–41)
Albumin: 4.5 g/dL (ref 3.5–5.0)
Alkaline Phosphatase: 79 U/L (ref 38–126)
Anion gap: 10 (ref 5–15)
BUN: 7 mg/dL (ref 6–20)
CO2: 24 mmol/L (ref 22–32)
Calcium: 9.2 mg/dL (ref 8.9–10.3)
Chloride: 100 mmol/L (ref 98–111)
Creatinine, Ser: 0.84 mg/dL (ref 0.44–1.00)
GFR, Estimated: >60 mL/min (ref >60)
Glucose, Bld: 151 mg/dL — ABNORMAL HIGH (ref 70–99)
Potassium: 3.3 mmol/L — ABNORMAL LOW (ref 3.5–5.1)
Sodium: 134 mmol/L — ABNORMAL LOW (ref 135–145)
Total Bilirubin: 0.5 mg/dL (ref 0.3–1.2)
Total Protein: 7.7 g/dL (ref 6.5–8.1)

## 2020-07-25 LAB — CBC WITH DIFFERENTIAL/PLATELET
Abs Immature Granulocytes: 0.05 10*3/uL (ref 0.00–0.07)
Basophils Absolute: 0.1 10*3/uL (ref 0.0–0.1)
Basophils Relative: 0 %
Eosinophils Absolute: 0.3 10*3/uL (ref 0.0–0.5)
Eosinophils Relative: 2 %
HCT: 40.5 % (ref 36.0–46.0)
Hemoglobin: 14.1 g/dL (ref 12.0–15.0)
Immature Granulocytes: 0 %
Lymphocytes Relative: 22 %
Lymphs Abs: 2.9 10*3/uL (ref 0.7–4.0)
MCH: 30.6 pg (ref 26.0–34.0)
MCHC: 34.8 g/dL (ref 30.0–36.0)
MCV: 87.9 fL (ref 80.0–100.0)
Monocytes Absolute: 0.6 10*3/uL (ref 0.1–1.0)
Monocytes Relative: 4 %
Neutro Abs: 9.2 10*3/uL — ABNORMAL HIGH (ref 1.7–7.7)
Neutrophils Relative %: 72 %
Platelets: 335 10*3/uL (ref 150–400)
RBC: 4.61 MIL/uL (ref 3.87–5.11)
RDW: 12.3 % (ref 11.5–15.5)
WBC: 13 10*3/uL — ABNORMAL HIGH (ref 4.0–10.5)
nRBC: 0 % (ref 0.0–0.2)

## 2020-07-25 LAB — URINALYSIS, ROUTINE W REFLEX MICROSCOPIC
Bilirubin Urine: NEGATIVE
Glucose, UA: NEGATIVE mg/dL
Hgb urine dipstick: NEGATIVE
Ketones, ur: NEGATIVE mg/dL
Leukocytes,Ua: NEGATIVE
Nitrite: NEGATIVE
Protein, ur: NEGATIVE mg/dL
Specific Gravity, Urine: 1.009 (ref 1.005–1.030)
pH: 7 (ref 5.0–8.0)

## 2020-07-25 LAB — RAPID URINE DRUG SCREEN, HOSP PERFORMED
Amphetamines: NOT DETECTED
Barbiturates: NOT DETECTED
Benzodiazepines: POSITIVE — AB
Cocaine: POSITIVE — AB
Opiates: NOT DETECTED
Tetrahydrocannabinol: NOT DETECTED

## 2020-07-25 LAB — ACETAMINOPHEN LEVEL: Acetaminophen (Tylenol), Serum: <10 ug/mL — ABNORMAL LOW (ref 10–30)

## 2020-07-25 LAB — APTT: aPTT: 32 seconds (ref 24–36)

## 2020-07-25 LAB — LIPID PANEL
Cholesterol: 168 mg/dL (ref 0–200)
HDL: 51 mg/dL (ref >40)
LDL Cholesterol: 79 mg/dL (ref 0–99)
Total CHOL/HDL Ratio: 3.3 RATIO
Triglycerides: 190 mg/dL — ABNORMAL HIGH (ref <150)
VLDL: 38 mg/dL (ref 0–40)

## 2020-07-25 LAB — MAGNESIUM: Magnesium: 1.8 mg/dL (ref 1.7–2.4)

## 2020-07-25 LAB — HEMOGLOBIN A1C
Hgb A1c MFr Bld: 5.7 % — ABNORMAL HIGH (ref 4.8–5.6)
Mean Plasma Glucose: 116.89 mg/dL

## 2020-07-25 LAB — ECHOCARDIOGRAM COMPLETE
Area-P 1/2: 3.33 cm2
Height: 59 in
S' Lateral: 2.42 cm
Weight: 2160 oz

## 2020-07-25 LAB — ETHANOL: Alcohol, Ethyl (B): <10 mg/dL (ref <10)

## 2020-07-25 LAB — CBG MONITORING, ED: Glucose-Capillary: 127 mg/dL — ABNORMAL HIGH (ref 70–99)

## 2020-07-25 LAB — SALICYLATE LEVEL: Salicylate Lvl: <7 mg/dL — ABNORMAL LOW (ref 7.0–30.0)

## 2020-07-25 MED ORDER — ASPIRIN 325 MG PO TABS
325.0000 mg | ORAL_TABLET | Freq: Once | ORAL | Status: AC
  Administered 2020-07-25: 04:00:00 325 mg via ORAL
  Filled 2020-07-25: qty 1

## 2020-07-25 MED ORDER — ONDANSETRON HCL 4 MG/2ML IJ SOLN: 4.0000 mg | Freq: Four times a day (QID) | INTRAMUSCULAR | Status: DC | PRN

## 2020-07-25 MED ORDER — ASPIRIN 325 MG PO TABS
325.0000 mg | ORAL_TABLET | Freq: Once | ORAL | Status: AC
  Administered 2020-07-25: 08:00:00 325 mg via ORAL
  Filled 2020-07-25: qty 1

## 2020-07-25 MED ORDER — STROKE: EARLY STAGES OF RECOVERY BOOK
Freq: Once | Status: DC
  Filled 2020-07-25 (×2): qty 1

## 2020-07-25 MED ORDER — ONDANSETRON HCL 4 MG PO TABS: 4.0000 mg | ORAL_TABLET | Freq: Four times a day (QID) | ORAL | Status: DC | PRN

## 2020-07-25 MED ORDER — ACETAMINOPHEN 650 MG RE SUPP: 650.0000 mg | Freq: Four times a day (QID) | RECTAL | Status: DC | PRN

## 2020-07-25 MED ORDER — IOHEXOL 350 MG/ML SOLN
100.0000 mL | Freq: Once | INTRAVENOUS | Status: AC | PRN
  Administered 2020-07-25: 04:00:00 100 mL via INTRAVENOUS

## 2020-07-25 MED ORDER — NICOTINE 21 MG/24HR TD PT24: 21.0000 mg | MEDICATED_PATCH | Freq: Every day | TRANSDERMAL | Status: DC | PRN

## 2020-07-25 MED ORDER — ENOXAPARIN SODIUM 40 MG/0.4ML ~~LOC~~ SOLN
40.0000 mg | SUBCUTANEOUS | Status: DC
  Administered 2020-07-25: 10:00:00 40 mg via SUBCUTANEOUS
  Filled 2020-07-25: qty 0.4

## 2020-07-25 MED ORDER — ACETAMINOPHEN 325 MG PO TABS: 650.0000 mg | ORAL_TABLET | Freq: Four times a day (QID) | ORAL | Status: DC | PRN

## 2020-07-25 MED ORDER — POTASSIUM CHLORIDE CRYS ER 20 MEQ PO TBCR
40.0000 meq | EXTENDED_RELEASE_TABLET | Freq: Once | ORAL | Status: AC
  Administered 2020-07-25: 06:00:00 40 meq via ORAL
  Filled 2020-07-25: qty 2

## 2020-07-25 NOTE — ED Notes (Signed)
PT in to eval 

## 2020-07-25 NOTE — Evaluation (Signed)
Physical Therapy Evaluation Patient Details Name: Monica [name] MRN: [id number] DOB: 09-23-1965 Today's Date: 07/25/2020   History of Present Illness  Monica [name] is a 54 y.o. female with medical history significant of anxiety, hyperlipidemia, endometriosis, hypertension, osteoporosis, grade 3 spondylolisthesis, L5-S1 laminectomy/fusion, unspecified tachycardia, vitamin D deficiency who is brought to the emergency department via EMS due to left arm numbness associated with left-sided headache and brief syncopal episodes witnessed by the patient's husband.  The patient states that around 2300 she took 2 Flexeril send the melatonin to go to sleep, which she normally uses, but was unable to fall asleep.  She was in the kitchen talking to her husband around 0145 when she had sudden onset of left sided headache which felt like it was radiating down to her LUE.  She felt something was unusual.  Shortly after this, the patient had some brief episodes of LOC.  Her husband had to avert her from falling.  After she regained consciousness, she noticed that her left side of face and LUE felt numb.  She continued to have a headache, but not as intense.  She denies visual changes, nausea, emesis, slurred speech or gait instability.  No chest pain, palpitations, dizziness, diaphoresis, lower extremity edema, PND or orthopnea.  She denies fever, chills, sore throat, rhinorrhea.  No abdominal pain, diarrhea, constipation, melena or hematochezia.  Denies dysuria, frequency or hematuria.  No polyuria, polydipsia, polyphagia or blurred vision.    Clinical Impression  Patient functioning near baseline for functional mobility and gait, demonstrates good return for ambulation in room and hallways without loss of balance.  Plan:  Patient discharged from physical therapy to care of nursing for ambulation daily as tolerated for length of stay.     Follow Up Recommendations No PT follow up    Equipment  Recommendations  None recommended by PT    Recommendations for Other Services       Precautions / Restrictions Precautions Precautions: None Restrictions Weight Bearing Restrictions: No      Mobility  Bed Mobility Overal bed mobility: Independent                  Transfers Overall transfer level: Independent                  Ambulation/Gait Ambulation/Gait assistance: Modified independent (Device/Increase time) Gait Distance (Feet): 100 Feet Assistive device: None Gait Pattern/deviations: WFL(Within Functional Limits) Gait velocity: slightly decreased   General Gait Details: demonstrates good return for ambulation in room and hallways without loss of balance  Stairs            Wheelchair Mobility    Modified Rankin (Stroke Patients Only)       Balance Overall balance assessment: No apparent balance deficits (not formally assessed)                                           Pertinent Vitals/Pain Pain Assessment: No/denies pain    Home Living Family/patient expects to be discharged to:: Private residence Living Arrangements: Spouse/significant other Available Help at Discharge: Family;Available 24 hours/day Type of Home: Mobile home Home Access: Stairs to enter Entrance Stairs-Rails: Right;Left;Can reach both Entrance Stairs-Number of Steps: 4 Home Layout: One level Home Equipment: Cane - single point      Prior Function Level of Independence: Independent         Comments: Community  ambulator, drives     Hand Dominance        Extremity/Trunk Assessment   Upper Extremity Assessment Upper Extremity Assessment: Defer to OT evaluation    Lower Extremity Assessment Lower Extremity Assessment: Overall WFL for tasks assessed    Cervical / Trunk Assessment Cervical / Trunk Assessment: Normal  Communication   Communication: No difficulties  Cognition Arousal/Alertness: Awake/alert Behavior During Therapy:  WFL for tasks assessed/performed Overall Cognitive Status: Within Functional Limits for tasks assessed                                        General Comments      Exercises     Assessment/Plan    PT Assessment Patent does not need any further PT services  PT Problem List         PT Treatment Interventions      PT Goals (Current goals can be found in the Care Plan section)  Acute Rehab PT Goals Patient Stated Goal: return home PT Goal Formulation: With patient Time For Goal Achievement: 07/25/20 Potential to Achieve Goals: Good    Frequency     Barriers to discharge        Co-evaluation               AM-PAC PT "6 Clicks" Mobility  Outcome Measure Help needed turning from your back to your side while in a flat bed without using bedrails?: None Help needed moving from lying on your back to sitting on the side of a flat bed without using bedrails?: None Help needed moving to and from a bed to a chair (including a wheelchair)?: None Help needed standing up from a chair using your arms (e.g., wheelchair or bedside chair)?: None Help needed to walk in hospital room?: None Help needed climbing 3-5 steps with a railing? : None 6 Click Score: 24    End of Session   Activity Tolerance: Patient tolerated treatment well Patient left: in bed;with call bell/phone within reach Nurse Communication: Mobility status PT Visit Diagnosis: Unsteadiness on feet (R26.81);Other abnormalities of gait and mobility (R26.89);Muscle weakness (generalized) (M62.81)    Time: 0347-4259 PT Time Calculation (min) (ACUTE ONLY): 11 min   Charges:   PT Evaluation $PT Eval Low Complexity: 1 Low PT Treatments $Therapeutic Activity: 8-22 mins        10:06 AM, 07/25/20 Ocie Bob, MPT Physical Therapist with [facility] [phone] office [phone] mobile phone

## 2020-07-25 NOTE — Consult Note (Signed)
[facility] Tele[facility]urology Consult Services   Date of Service:   07/25/2020 03:20:42  Diagnosis:     .  R55 - Syncope (blackout, fainting, vasovagal attack)  Impression: Monica [name] is a 54 yo F w/ a hx of HTN, HLD who presents with syncope and left sided numbness. No other deficits on exam. Head CT showed no acute intracranial pathology. No LVO on CTA. Presentation is concerning for focal seizure with post-ictal hemisensory disturbance. Cannot entirely exclude ischemic stroke (transient basilar occlusion with recanalization). Risks of tPA outweigh the benefits given nondisabling symptoms. Recommend the following:  - ASA 325 mg once  - MRI brain w/o contrast  - EEG  - If patient has recurrent spell, would start Keppra 1 g bid  - telemetry  - orthostatic vitals  - No driving for 6 months - Neurology to follow  Metrics: Last Known Well: 07/25/2020 01:45:00 [facility] Notification Time: 07/25/2020 03:20:42 Arrival Time: 07/25/2020 03:14:00 Stamp Time: 07/25/2020 03:20:42 Initial Response Time: 07/25/2020 03:23:44 Time First Login Attempt: 07/25/2020 03:23:44 Symptoms: syncope and left sided numbness. NIHSS Start Assessment Time: 07/25/2020 03:26:45 Patient is not a candidate for Thrombolytic. Thrombolytic Medical Decision: 07/25/2020 03:28:41 Patient was not deemed candidate for Thrombolytic because of following reasons: No disabling symptoms.  CT head showed no acute hemorrhage or acute core infarct.  ED Physician notified of diagnostic impression and management plan on 07/25/2020 04:05:00  Advanced Imaging: CTA Head and Neck Completed.  LVO:No   Our recommendations are outlined below.  Recommendations:     .  Activate Stroke Protocol Admission/Order Set     .  Stroke/Telemetry Floor     .  Neuro Checks     .  Bedside Swallow Eval     .  DVT Prophylaxis     .  IV Fluids, Normal Saline     .  Head of Bed 30 Degrees     .  Euglycemia and Avoid Hyperthermia  (PRN Acetaminophen)     .  Initiate Aspirin 325 MG Daily  Routine Consultation with Inhouse Neurology for Follow up Care  Sign Out:     .  Discussed with Emergency Department Provider    ------------------------------------------------------------------------------  History of Present Illness: Patient is a 53 year old Female.  Patient was brought by EMS for symptoms of syncope and left sided numbness.  Monica [name] is a 54 yo F w/ a hx of HTN, HLD who presents with syncope and left sided numbness. LKN at 0145. Around that time, had pain and numbness in her left face and arm, experienced a "bad feeling" all over, and then lost consciousness. Intermittently regained and then lost consciousness for several minutes. Of note, EMS noted incontinence on scence. Back to baseline now with the exception of left face and arm numbness. No other deficits on exam. Head CT showed no acute intracranial pathology. No LVO on CTA.  Last seen normal was within 4.5 hours. There is no history of hemorrhagic complications or intracranial hemorrhage. There is no history of Recent Anticoagulants. There is no history of recent major surgery. There is no history of recent stroke.  Past Medical History:     . Hypertension     . Hyperlipidemia  Social History: Smoking: Yes Alcohol Use: No Drug Use: No  Family History:Unable to obtain due to Patient Status  Review of System:  14 Points Review of Systems was performed and was negative except mentioned in HPI.  Anticoagulant use:  No  Antiplatelet use: Yes ASA  Allergies:  Reviewed  Examination: BP(107/77), Pulse(79), Blood Glucose(127) 1A: Level of Consciousness - Alert; keenly responsive + 0 1B: Ask Month and Age - Both Questions Right + 0 1C: Blink Eyes & Squeeze Hands - Performs Both Tasks + 0 2: Test Horizontal Extraocular Movements - Normal + 0 3: Test Visual Fields - No Visual Loss + 0 4: Test Facial Palsy (Use Grimace if Obtunded) -  Normal symmetry + 0 5A: Test Left Arm Motor Drift - No Drift for 10 Seconds + 0 5B: Test Right Arm Motor Drift - No Drift for 10 Seconds + 0 6A: Test Left Leg Motor Drift - No Drift for 5 Seconds + 0 6B: Test Right Leg Motor Drift - No Drift for 5 Seconds + 0 7: Test Limb Ataxia (FNF/Heel-Shin) - No Ataxia + 0 8: Test Sensation - Mild-Moderate Loss: Less Sharp/More Dull + 1 9: Test Language/Aphasia - Normal; No aphasia + 0 10: Test Dysarthria - Normal + 0 11: Test Extinction/Inattention - No abnormality + 0  NIHSS Score: 1  Pre-Morbid Modified Rankin Scale: 0 Points = No symptoms at all   Patient/Family was informed the Neurology Consult would occur via TeleHealth consult by way of interactive audio and video telecommunications and consented to receiving care in this manner.   Patient is being evaluated for possible acute neurologic impairment and high probability of imminent or life-threatening deterioration. I spent total of 30 minutes providing care to this patient, including time for face to face visit via telemedicine, review of medical records, imaging studies and discussion of findings with providers, the patient and/or family.   Dr Vicente Masson   [facility] [phone]  Case [id number]

## 2020-07-25 NOTE — ED Notes (Signed)
MRI completed.

## 2020-07-25 NOTE — Progress Notes (Signed)
EEG Completed; Results Pending  

## 2020-07-25 NOTE — ED Notes (Signed)
Pt passed stroke swallowing screen.

## 2020-07-25 NOTE — Discharge Summary (Signed)
Physician Discharge Summary  Monica [name] ZOX:[id number] DOB: [birth year] DOA: 07/25/2020  PCP: Elfredia Nevins, MD  Admit date: 07/25/2020 Discharge date: 07/25/2020  Admitted From: home Disposition:  home  Recommendations for Outpatient Follow-up:  1. Follow up with PCP in 1-2 weeks 2. Please obtain BMP/CBC in one week 3. Outpatient referral to neurology has been made 4. EEG report pending at the time of discharge  Home Health: Equipment/Devices:  Discharge Condition:stable CODE STATUS:full code Diet recommendation: heart healthy  Brief/Interim Summary: 54 year old female with history of hyperlipidemia, hypertension, anxiety, presents to the emergency room with left arm numbness and tingling.  Patient reports that she was in her usual state of health yesterday.  She was standing when suddenly developed left-sided headache.  This was associated with left-sided facial numbness and tingling which also involved her left upper extremity.  The patient began to "fall out" and her husband caught her, preventing her from falling to the ground.  It is unclear whether she truly lost consciousness, but patient did not have any memory of near falling.  Her husband reports that her speech was somewhat aphasic and she was having difficulty making a sentence.  EMS was called and she was transported to the hospital.  Upon arrival to the hospital, patient noted that her speech had returned to normal and she was awake and coherent.  She reports that her symptoms lasted approximately 15 minutes.  Since that time, her left-sided weakness and numbness has resolved.  She is not had any vision injuries.  She does report having intermittent headaches for the past 6 weeks which are worse in the evenings.  These are frontal headaches.  She is not had any eye pain or blurry vision.  She has not had any trouble with her speech.  She has not had any episodes of passing out.  Discharge Diagnoses:  Principal  Problem:   Left arm numbness Active Problems:   GERD   Hypercholesterolemia   Hypokalemia   Hypertension   Hyperglycemia   Tobacco use  Patient was evaluated by teleneurology.  Work-up including CT angio of the head and neck as well as MRI did not show any evidence of stroke or other acute findings.  Echocardiogram was done which was also unremarkable.  Interestingly, urine toxicology screen was positive for cocaine.  Patient denies use of any recreational drugs.  She was evaluated by tele neurology who recommended admission for further work-up including MRI brain, EEG.  MRI brain from did not show any acute stroke.  EEG performed, with results pending.  Patient was very insistent on being discharged home and did not want to stay in the hospital for further work-up.  Case was reviewed with neurology on-call, Dr. Tempie Hoist.  Since patient's overall symptoms had resolved work-up was largely unrevealing, it was felt reasonable to discharge the patient to have outpatient neurology follow-up.  She was advised not to drive, operate heavy machinery until cleared by neurology.  She was also advised to avoid spending time and it tended in standing water.  She was advised to return to the hospital if she had any recurrence/worsening of symptoms.  Outpatient referral to neurology has been made for follow-up.  Discharge Instructions  Discharge Instructions    Ambulatory referral to Neurology   Complete by: As directed    An appointment is requested in approximately: next available for hospital follow. Patient would be a new patient to the office. Patient presented with transient left face numbness and left arm numbness after  an episode of headache and possible syncope   Diet - low sodium heart healthy   Complete by: As directed    Driving Restrictions   Complete by: As directed    No driving until cleared by neurology   Increase activity slowly   Complete by: As directed      Allergies as of 07/25/2020    No Known Allergies     Medication List    STOP taking these medications   metoprolol succinate 50 MG 24 hr tablet Commonly known as: TOPROL-XL   nitrofurantoin (macrocrystal-monohydrate) 100 MG capsule Commonly known as: MACROBID   NONFORMULARY OR COMPOUNDED ITEM   Zantac 150 MG tablet Generic drug: ranitidine     TAKE these medications   ALPRAZolam 0.25 MG tablet Commonly known as: XANAX TAKE ONE TABLET BY MOUTH AT BEDTIME AS NEEDED.   amitriptyline 10 MG tablet Commonly known as: ELAVIL Take 10-30 mg by mouth at bedtime.   amLODipine 5 MG tablet Commonly known as: NORVASC Take 5 mg by mouth daily.   aspirin 81 MG tablet Take 81 mg by mouth daily.   COQ10 PO Take 1 tablet by mouth daily. Along with crestor   cyclobenzaprine 10 MG tablet Commonly known as: FLEXERIL Take 10 mg by mouth 3 (three) times daily as needed for muscle spasms.   fish oil-omega-3 fatty acids 1000 MG capsule Take 2 g by mouth daily.   rosuvastatin 10 MG tablet Commonly known as: CRESTOR Take 10 mg by mouth daily.   VITAMIN D3 PO Take 1 tablet by mouth daily.       No Known Allergies  Consultations:  Tele neurology   Procedures/Studies: MR BRAIN WO CONTRAST  Result Date: 07/25/2020 CLINICAL DATA:  Neuro deficit, acute, stroke suspected. Weakness and confusion. EXAM: MRI HEAD WITHOUT CONTRAST TECHNIQUE: Multiplanar, multiecho pulse sequences of the brain and surrounding structures were obtained without intravenous contrast. COMPARISON:  Noncontrast head CT 07/25/2020, CT angiogram head/neck 07/25/2020. FINDINGS: Brain: Cerebral volume is normal for age. Multifocal T2/FLAIR hyperintensity within the cerebral white matter is overall mild, but advanced for age. The largest focus of signal abnormality within the left periatrial region measures 11 mm (series 12, image 17). There is no acute infarct. No evidence of intracranial mass. No chronic intracranial blood products. No  extra-axial fluid collection. No midline shift. Vascular: Expected proximal arterial flow voids. Skull and upper cervical spine: No focal marrow lesion. Incompletely assessed cervical spondylosis. Sinuses/Orbits: Visualized orbits show no acute finding. Mild mucosal thickening along the floor of the left maxillary sinus. IMPRESSION: No evidence of acute intracranial abnormality. Multifocal T2/FLAIR hyperintensity within the cerebral white matter is overall mild, but advanced for the patient's age. Findings are nonspecific and differential considerations include chronic small vessel ischemic disease, a demyelinating process such as multiple sclerosis, sequelae of a prior infectious/inflammatory process, migraine headaches, among others. Mild left maxillary sinus mucosal thickening. Electronically Signed   By: Jackey Loge DO   On: 07/25/2020 10:11   US Carotid Bilateral (at [facility] and [facility] only)  Result Date: 07/25/2020 CLINICAL DATA:  Left arm numbness EXAM: BILATERAL CAROTID DUPLEX ULTRASOUND TECHNIQUE: Wallace Cullens scale imaging, color Doppler and duplex ultrasound were performed of bilateral carotid and vertebral arteries in the neck. COMPARISON:  None. FINDINGS: Criteria: Quantification of carotid stenosis is based on velocity parameters that correlate the residual internal carotid diameter with NASCET-based stenosis levels, using the diameter of the distal internal carotid lumen as the denominator for stenosis measurement. The following velocity measurements were  obtained: RIGHT ICA: 82/34 cm/sec CCA: 77/25 cm/sec SYSTOLIC ICA/CCA RATIO:  1.1 ECA:  76 cm/sec LEFT ICA: 81/28 cm/sec CCA: 82/28 cm/sec SYSTOLIC ICA/CCA RATIO:  1.0 ECA:  60 cm/sec RIGHT CAROTID ARTERY: No significant atherosclerotic plaque or evidence of stenosis in the internal carotid artery. RIGHT VERTEBRAL ARTERY:  Patent with normal antegrade flow. LEFT CAROTID ARTERY: Trace focal echogenic atherosclerotic plaque in the proximal internal carotid  artery. By peak systolic velocity criteria, the estimated stenosis remains less than 50%. LEFT VERTEBRAL ARTERY:  Patent with normal antegrade flow. IMPRESSION: 1. Mild (1-49%) stenosis proximal left internal carotid artery secondary to trace focal echogenic atherosclerotic plaque. 2. No significant atherosclerotic plaque or evidence of stenosis in the right internal carotid artery. 3. Vertebral arteries are patent with normal antegrade flow. Signed, Sterling BigHeath K. McCullough, MD, RPVI [facility] Electronically Signed   By: Malachy MoanHeath  McCullough M.D.   On: 07/25/2020 09:39   ECHOCARDIOGRAM COMPLETE  Result Date: 07/25/2020    ECHOCARDIOGRAM REPORT   Patient Name:   Monica [name] Date of Exam: 07/25/2020 Medical Rec #:  [id number]         Height:       59.0 in Accession #:    [id number]        Weight:       135.0 lb Date of Birth:  Nov 26, 1965         BSA:          1.561 m Patient Age:    54 years          BP:           118/86 mmHg Patient Gender: F                 HR:           74 bpm. [facility] Procedure: 2D Echo Indications:    TIA 435.9 / G45.9  History:        Patient has no prior history of Echocardiogram examinations.                 Risk Factors:Dyslipidemia, Hypertension and Former Smoker. GERD.  Sonographer:    Jeryl ColumbiaJohanna Elliott RDCS (AE) Referring Phys: [id number] DAVID MANUEL ORTIZ IMPRESSIONS  1. Septal motion consistent with conduction delay.. Left ventricular ejection fraction, by estimation, is 55 to 60%. The left ventricle has normal function. Left ventricular diastolic parameters were normal.  2. Right ventricular systolic function is normal. The right ventricular size is normal.  3. The mitral valve is normal in structure. No evidence of mitral valve regurgitation.  4. The aortic valve is normal in structure. Aortic valve regurgitation is not visualized.  5. The inferior vena cava is normal in size with greater than 50%  respiratory variability, suggesting right atrial pressure of 3 mmHg. FINDINGS  Left Ventricle: Septal motion consistent with conduction delay. Left ventricular ejection fraction, by estimation, is 55 to 60%. The left ventricle has normal function. The left ventricle demonstrates regional wall motion abnormalities. The left ventricular internal cavity size was normal in size. There is no left ventricular hypertrophy. Left ventricular diastolic parameters were normal. Right Ventricle: The right ventricular size is normal. Right vetricular wall thickness was not assessed. Right ventricular systolic function is normal. Left Atrium: Left atrial size was normal in size. Right Atrium: Right atrial size was normal in size. Pericardium: There is no evidence of pericardial effusion. Mitral Valve: The mitral valve is normal in structure.  No evidence of mitral valve regurgitation. Tricuspid Valve: The tricuspid valve is normal in structure. Tricuspid valve regurgitation is trivial. Aortic Valve: The aortic valve is normal in structure. Aortic valve regurgitation is not visualized. Pulmonic Valve: The pulmonic valve was not well visualized. Pulmonic valve regurgitation is not visualized. Aorta: The aortic root is normal in size and structure. Venous: The inferior vena cava is normal in size with greater than 50% respiratory variability, suggesting right atrial pressure of 3 mmHg. IAS/Shunts: No atrial level shunt detected by color flow Doppler.  LEFT VENTRICLE PLAX 2D LVIDd:         3.43 cm  Diastology LVIDs:         2.42 cm  LV e' medial:    8.16 cm/s LV PW:         0.98 cm  LV E/e' medial:  8.4 LV IVS:        0.96 cm  LV e' lateral:   7.40 cm/s LVOT diam:     1.80 cm  LV E/e' lateral: 9.3 LVOT Area:     2.54 cm  RIGHT VENTRICLE RV S prime:     11.10 cm/s TAPSE (M-mode): 1.7 cm LEFT ATRIUM             Index       RIGHT ATRIUM          Index LA diam:        2.60 cm 1.67 cm/m  RA Area:     7.84 cm LA Vol (A2C):   18.0 ml 11.53  ml/m RA Volume:   15.20 ml 9.74 ml/m LA Vol (A4C):   17.1 ml 10.96 ml/m LA Biplane Vol: 18.2 ml 11.66 ml/m   AORTA Ao Root diam: 2.00 cm MITRAL VALVE MV Area (PHT): 3.33 cm    SHUNTS MV Decel Time: 228 msec    Systemic Diam: 1.80 cm MV E velocity: 68.60 cm/s MV A velocity: 47.60 cm/s MV E/A ratio:  1.44 Dietrich Pates MD Electronically signed by Dietrich Pates MD Signature Date/Time: 07/25/2020/3:42:10 PM    Final    CT HEAD CODE STROKE WO CONTRAST  Result Date: 07/25/2020 CLINICAL DATA:  Code stroke. Left-sided facial numbness and tingling. Left head pain. Disoriented. Last known well at 1:45 a.m. EXAM: CT HEAD WITHOUT CONTRAST TECHNIQUE: Contiguous axial images were obtained from the base of the skull through the vertex without intravenous contrast. COMPARISON:  None. FINDINGS: Brain: No acute infarct, hemorrhage, or mass lesion is present. No significant extraaxial fluid collection is present. No significant white matter lesions are present. The ventricles are of normal size. Basal ganglia are within normal limits. Insular ribbon is normal bilaterally. The brainstem and cerebellum are within normal limits. Vascular: No hyperdense vessel or unexpected calcification. Skull: Calvarium is intact. No focal lytic or blastic lesions are present. No significant extracranial soft tissue lesion is present. Sinuses/Orbits: The paranasal sinuses and mastoid air cells are clear. The globes and orbits are within normal limits. ASPECTS [facility] Stroke Program Early CT Score) - Ganglionic level infarction (caudate, lentiform nuclei, internal capsule, insula, M1-M3 cortex): 3/3 - Supraganglionic infarction (M4-M6 cortex): 7/7 Total score (0-10 with 10 being normal): 10/10 IMPRESSION: 1. Negative CT of the head. 2. ASPECTS is 10/10. These results were called by telephone at the time of interpretation on 07/25/2020 at 3:31 am to provider IVA KNAPP , who verbally acknowledged these results. Electronically Signed   By: Marin Roberts M.D.   On: 07/25/2020 03:32   CT ANGIO  HEAD CODE STROKE  Result Date: 07/25/2020 CLINICAL DATA:  54 year old female code stroke presentation last known well 0145 hours. EXAM: CT ANGIOGRAPHY HEAD AND NECK TECHNIQUE: Multidetector CT imaging of the head and neck was performed using the [name] protocol during bolus administration of intravenous contrast. Multiplanar CT image reconstructions and MIPs were obtained to evaluate the vascular anatomy. Carotid stenosis measurements (when applicable) are obtained utilizing NASCET criteria, using the distal internal carotid diameter as the denominator. CONTRAST:  OMNIPAQUE IOHEXOL 350 MG/ML SOLN COMPARISON:  Plain head CT 0317 hours today. FINDINGS: CTA NECK Skeleton: Advanced C4-C5 through C6-C7 cervical disc and endplate degeneration including some vacuum disc. Congenital incomplete ossification of the posterior C1 ring. No acute osseous abnormality identified. Upper chest: Negative. Other neck: Retained secretions in the nasal cavity. Asymmetric atrophy of the right parotid gland. Aortic arch: 3 vessel arch configuration with minimal arch atherosclerosis. Right carotid system: Minimal atherosclerosis at the right ICA origin without stenosis. Left carotid system: Minimal atherosclerosis left ICA bulb. No stenosis. Vertebral arteries: Proximal right subclavian artery and right vertebral artery origin are normal. Right vertebral artery is patent and normal to the skull base. Minimal to mild atherosclerosis proximal left subclavian artery without stenosis. Normal left vertebral artery origin. Left vertebral artery is patent and normal to the skull base. CTA HEAD Posterior circulation: Codominant distal vertebral arteries are normal to the vertebrobasilar junction. Normal right PICA origin. Left AICA appears dominant and patent. Patent basilar artery without stenosis. Patent SCA and left PCA origins. Fetal right PCA origin. Left posterior communicating  artery diminutive or absent. Bilateral PCA branches are within normal limits. Anterior circulation: Both ICA siphons are patent. The right siphon appears mildly dominant. No siphon plaque or stenosis. Normal ophthalmic and right posterior communicating artery origins. Patent carotid termini, MCA and ACA origins. Diminutive anterior communicating artery. Small median artery of the corpus callosum (normal variant). ACA branches are within normal limits. Left MCA M1 segment bifurcates early without stenosis. Left MCA branches are within normal limits. Right MCA M1 segment and bifurcation are patent without stenosis. Right MCA branches are within normal limits. Venous sinuses: Patent. Anatomic variants: Fetal right PCA origin. Review of the MIP images confirms the above findings IMPRESSION: 1. Negative for large vessel occlusion. 2. Minimal atherosclerosis.  No arterial stenosis identified. 3. 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 Electronically Signed   By: Odessa Fleming M.D.   On: 07/25/2020 05:11   CT ANGIO NECK CODE STROKE  Result Date: 07/25/2020 CLINICAL DATA:  54 year old female code stroke presentation last known well 0145 hours. EXAM: CT ANGIOGRAPHY HEAD AND NECK TECHNIQUE: Multidetector CT imaging of the head and neck was performed using the [name] protocol during bolus administration of intravenous contrast. Multiplanar CT image reconstructions and MIPs were obtained to evaluate the vascular anatomy. Carotid stenosis measurements (when applicable) are obtained utilizing NASCET criteria, using the distal internal carotid diameter as the denominator. CONTRAST:  OMNIPAQUE IOHEXOL 350 MG/ML SOLN COMPARISON:  Plain head CT 0317 hours today. FINDINGS: CTA NECK Skeleton: Advanced C4-C5 through C6-C7 cervical disc and endplate degeneration including some vacuum disc. Congenital incomplete ossification of the posterior C1 ring. No acute osseous abnormality identified. Upper  chest: Negative. Other neck: Retained secretions in the nasal cavity. Asymmetric atrophy of the right parotid gland. Aortic arch: 3 vessel arch configuration with minimal arch atherosclerosis. Right carotid system: Minimal atherosclerosis at the right ICA origin without stenosis. Left carotid system: Minimal atherosclerosis left ICA bulb. No  stenosis. Vertebral arteries: Proximal right subclavian artery and right vertebral artery origin are normal. Right vertebral artery is patent and normal to the skull base. Minimal to mild atherosclerosis proximal left subclavian artery without stenosis. Normal left vertebral artery origin. Left vertebral artery is patent and normal to the skull base. CTA HEAD Posterior circulation: Codominant distal vertebral arteries are normal to the vertebrobasilar junction. Normal right PICA origin. Left AICA appears dominant and patent. Patent basilar artery without stenosis. Patent SCA and left PCA origins. Fetal right PCA origin. Left posterior communicating artery diminutive or absent. Bilateral PCA branches are within normal limits. Anterior circulation: Both ICA siphons are patent. The right siphon appears mildly dominant. No siphon plaque or stenosis. Normal ophthalmic and right posterior communicating artery origins. Patent carotid termini, MCA and ACA origins. Diminutive anterior communicating artery. Small median artery of the corpus callosum (normal variant). ACA branches are within normal limits. Left MCA M1 segment bifurcates early without stenosis. Left MCA branches are within normal limits. Right MCA M1 segment and bifurcation are patent without stenosis. Right MCA branches are within normal limits. Venous sinuses: Patent. Anatomic variants: Fetal right PCA origin. Review of the MIP images confirms the above findings IMPRESSION: 1. Negative for large vessel occlusion. 2. Minimal atherosclerosis.  No arterial stenosis identified. 3. Advanced cervical spine C4-C5 through C6-C7  disc and endplate degeneration. Electronically Signed   By: Odessa Fleming M.D.   On: 07/25/2020 05:11       Subjective: No further numbness or tingling in left arm or left face  Discharge Exam: Vitals:   07/25/20 1500 07/25/20 1515 07/25/20 1600 07/25/20 1700  BP:  113/79 110/78 112/80  Pulse: 73 72 76   Resp: 16 13 18    Temp:      TempSrc:      SpO2: 96% 99% 100%   Weight:      Height:        General: Pt is alert, awake, not in acute distress Cardiovascular: RRR, S1/S2 +, no rubs, no gallops Respiratory: CTA bilaterally, no wheezing, no rhonchi Abdominal: Soft, NT, ND, bowel sounds + Extremities: no edema, no cyanosis    The results of significant diagnostics from this hospitalization (including imaging, microbiology, ancillary and laboratory) are listed below for reference.     Microbiology: No results found for this or any previous visit (from the past 240 hour(s)).   Labs: BNP (last 3 results) No results for input(s): BNP in the last 8760 hours. Basic Metabolic Panel: Recent Labs  Lab 07/25/20 0230 07/25/20 0604  NA 134*  --   K 3.3*  --   CL 100  --   CO2 24  --   GLUCOSE 151*  --   BUN 7  --   CREATININE 0.84  --   CALCIUM 9.2  --   MG  --  1.8   Liver Function Tests: Recent Labs  Lab 07/25/20 0230  AST 25  ALT 21  ALKPHOS 79  BILITOT 0.5  PROT 7.7  ALBUMIN 4.5   No results for input(s): LIPASE, AMYLASE in the last 168 hours. No results for input(s): AMMONIA in the last 168 hours. CBC: Recent Labs  Lab 07/25/20 0230  WBC 13.0*  NEUTROABS 9.2*  HGB 14.1  HCT 40.5  MCV 87.9  PLT 335   Cardiac Enzymes: No results for input(s): CKTOTAL, CKMB, CKMBINDEX, TROPONINI in the last 168 hours. BNP: Invalid input(s): POCBNP CBG: Recent Labs  Lab 07/25/20 0316  GLUCAP 127*   D-Dimer  No results for input(s): DDIMER in the last 72 hours. Hgb A1c No results for input(s): HGBA1C in the last 72 hours. Lipid Profile Recent Labs     07/25/20 1523  CHOL 168  HDL 51  LDLCALC 79  TRIG 190*  CHOLHDL 3.3   Thyroid function studies No results for input(s): TSH, T4TOTAL, T3FREE, THYROIDAB in the last 72 hours.  Invalid input(s): FREET3 Anemia work up No results for input(s): VITAMINB12, FOLATE, FERRITIN, TIBC, IRON, RETICCTPCT in the last 72 hours. Urinalysis    Component Value Date/Time   COLORURINE STRAW (A) 07/25/2020 0240   APPEARANCEUR CLEAR 07/25/2020 0240   LABSPEC 1.009 07/25/2020 0240   PHURINE 7.0 07/25/2020 0240   GLUCOSEU NEGATIVE 07/25/2020 0240   HGBUR NEGATIVE 07/25/2020 0240   BILIRUBINUR NEGATIVE 07/25/2020 0240   KETONESUR NEGATIVE 07/25/2020 0240   PROTEINUR NEGATIVE 07/25/2020 0240   NITRITE NEGATIVE 07/25/2020 0240   LEUKOCYTESUR NEGATIVE 07/25/2020 0240   Sepsis Labs Invalid input(s): PROCALCITONIN,  WBC,  LACTICIDVEN Microbiology No results found for this or any previous visit (from the past 240 hour(s)).   Time coordinating discharge:  SIGNED:   Erick Blinks, MD  [facility] 07/25/2020, 6:53 PM   If 7PM-7AM, please contact night-coverage [email or web address]

## 2020-07-25 NOTE — ED Triage Notes (Addendum)
EMS dispatched to patient's home for suspected overdose of amitryptiline and xanax. Per EMS pt was disoriented with unsteady gait but mentation improved after arrival to this facility. Pts gait is steady upon ambulation to ED stretcher. Pt denies overdose of any medication. Denies SI, HI, anxiety, or hx of suicidal attempts. Denies auditory, tactile, or visual hallucinations. Of note pt was incontinent of urine on EMS stretcher. After further assessment, pt reports a headache and feeling strange around 0145 while talking to her husband. Reports left arm sensation change. Husband confirms information.

## 2020-07-25 NOTE — Progress Notes (Signed)
*  PRELIMINARY RESULTS* Echocardiogram 2D Echocardiogram has been performed.  Monica [name] 07/25/2020, 1:57 PM

## 2020-07-25 NOTE — Progress Notes (Addendum)
Call time 0309 Beeper time:  Never beeper went off Exam started:   0316 Exam finished:  0318 Images to [facility]:  0318 Exam completed in Epic:  [facility] Radiology called [phone]  Dr Devoria Albe  [phone]  [facility] Emergency Department  Last known normal status was 1:35  MRN#  [id number]

## 2020-07-25 NOTE — ED Provider Notes (Signed)
[facility] EMERGENCY DEPARTMENT Provider Note   CSN: [id number] Arrival date & time: 07/25/20  0224   Time seen 2:55 AM  History Chief Complaint  Patient presents with  . Ingestion    EMS dispatched for overdose of amitriptyline and xanax, pt denies taking more medication than what is prescribed.  . Altered Mental Status    Monica [name] is a 54 y.o. female.  HPI   Patient states she took 2 Flexeril and melatonin about 11 PM in preparation to going to bed.  She states she and her husband were sitting at the kitchen table talking around 1:45 AM and she had acute onset of severe pain in her left head that went down into her left arm.  She states she felt very funny.  She then was having some intermittent episodes of loss of consciousness.  She states when she woke up her left face felt numb and her left arm feels numb.  She denies chest pain, shortness of breath, change in her vision, nausea or vomiting.  She states she did not fall because her husband held her up.  She states she has never had this happen before.  Husband reports at the time her tongues seemed to be very thick.  Patient is right-handed.  Patient denies any family history of stroke.  She denies being on hormone replacement.  She does smoke a half a pack per day and she is treated for hypertension and high cholesterol.  She denies history of diabetes.  Patient denies overdose, EMS seem to think she was a overdose patient.  PCP Elfredia Nevins, MD   Past Medical History:  Diagnosis Date  . Anxiety   . Endometriosis   . High cholesterol   . Hypertension   . NSVD (normal spontaneous vaginal delivery)    X2  . Osteoporosis   . Spondylolisthesis, grade 3   . Tachycardia   . Vitamin D deficiency     Patient Active Problem List   Diagnosis Date Noted  . Voiding difficulty 01/22/2013  . Osteoporosis, unspecified 01/22/2013  . History of cervical dysplasia 01/22/2013  . Hypercholesterolemia 06/04/2011  .  Perimenopausal vasomotor symptoms 06/04/2011  . ABNORMAL TRANSAMINASE, (LFT'S) 05/18/2010  . GERD 05/12/2010  . CONSTIPATION, CHRONIC 05/12/2010  . CHANGE IN BOWELS 05/12/2010    Past Surgical History:  Procedure Laterality Date  . ABDOMINAL HYSTERECTOMY  2003   LAVH/RSO  . BACK SURGERY  Sep 16 2012   L5-S1 LAMINECTOMY INFUSION  . CERVICAL BIOPSY  W/ LOOP ELECTRODE EXCISION    . CHOLECYSTECTOMY    . TUBAL LIGATION     POSTPARTUM     OB History    Gravida  2   Para  2   Term  2   Preterm      AB      Living  2     SAB      IAB      Ectopic      Multiple      Live Births  2           Family History  Problem Relation Age of Onset  . Breast cancer Mother   . Diabetes Father   . Hypertension Father   . Cancer Father        COLON, LYMPHOMA  . Diabetes Maternal Aunt   . Heart disease Maternal Aunt   . Cancer Paternal Grandfather        COLON    Social History  Tobacco Use  . Smoking status: Former Smoker    Types: Cigarettes    Quit date: 06/03/2010    Years since quitting: 10.1  . Smokeless tobacco: Never Used  Substance Use Topics  . Alcohol use: No    Alcohol/week: 0.0 standard drinks  . Drug use: No    Home Medications Prior to Admission medications   Medication Sig Start Date End Date Taking? Authorizing Provider  ALPRAZolam (XANAX) 0.25 MG tablet TAKE ONE TABLET BY MOUTH AT BEDTIME AS NEEDED. Patient not taking: Reported on 12/09/2015 11/30/13   Ok Edwards, MD  aspirin 81 MG tablet Take 81 mg by mouth daily.    [provider]  atorvastatin (LIPITOR) 40 MG tablet Take 40 mg by mouth daily.    [provider]  beta carotene w/minerals (OCUVITE) tablet Take 1 tablet by mouth daily.    [provider]  calcium carbonate (OS-CAL) 600 MG TABS Take 600 mg by mouth 2 (two) times daily with a meal.    [provider]  cyclobenzaprine (FLEXERIL) 10 MG tablet Take 10 mg by mouth 3 (three) times daily as  needed.      [provider]  estradiol (ESTRACE) 1 MG tablet Take 1 tablet (1 mg total) by mouth daily. Patient not taking: Reported on 12/09/2015 07/03/11 12/09/15  Ok Edwards, MD  estrogens, conjugated, (PREMARIN) 0.625 MG tablet Take 1 tablet (0.625 mg total) by mouth daily. Take daily for 21 days then do not take for 7 days. 06/14/11 06/13/12  Ok Edwards, MD  fish oil-omega-3 fatty acids 1000 MG capsule Take 2 g by mouth daily.    [provider]  gabapentin (NEURONTIN) 100 MG capsule Take 100 mg by mouth 3 (three) times daily. Reported on 12/09/2015    [provider]  HYDROcodone-acetaminophen (VICODIN) 2.5-500 MG per tablet Take 1 tablet by mouth every 6 (six) hours as needed for pain. Reported on 12/09/2015    [provider]  metoprolol (TOPROL-XL) 50 MG 24 hr tablet Take 100 mg by mouth daily.     [provider]  nitrofurantoin, macrocrystal-monohydrate, (MACROBID) 100 MG capsule Take 1 capsule (100 mg total) by mouth 2 (two) times daily. 12/16/15   Ok Edwards, MD  NONFORMULARY OR COMPOUNDED ITEM Take 1.25 mg by mouth daily. Triest SR (8/1/1)-Methyltestosterone 1.25-1.25mg  caps S: take one daily 07/25/16   Ok Edwards, MD  Teriparatide, Recombinant, (FORTEO) 600 MCG/2.4ML SOLN Inject 20 mcg into the skin.    [provider]  Vitamin D, Ergocalciferol, (DRISDOL) 50000 units CAPS capsule Take one tablet weekly for 12 weeks then have vitamin d level rechecked at office. 01/02/16   Ok Edwards, MD  ZANTAC 150 MG tablet TAKE ONE TABLET BY MOUTH IN THE EVENING AFTER DINNER, BEFORE BEDTIME. 11/02/11   Vester Balthazor Boop, MD    Allergies    Patient has no known allergies.  Review of Systems   Review of Systems  All other systems reviewed and are negative.   Physical Exam Updated Vital Signs BP 116/79   Pulse 75   Resp 17   SpO2 99%   Physical Exam Vitals and nursing note reviewed.  Constitutional:       General: She is not in acute distress.    Appearance: Normal appearance. She is normal weight. She is not ill-appearing or toxic-appearing.  HENT:     Head: Normocephalic and atraumatic.     Right Ear: External ear normal.  Left Ear: External ear normal.     Nose: Nose normal.     Mouth/Throat:     Mouth: Mucous membranes are dry.     Pharynx: No oropharyngeal exudate or posterior oropharyngeal erythema.  Eyes:     Extraocular Movements: Extraocular movements intact.     Conjunctiva/sclera: Conjunctivae normal.     Pupils: Pupils are equal, round, and reactive to light.  Cardiovascular:     Rate and Rhythm: Normal rate and regular rhythm.     Pulses: Normal pulses.     Heart sounds: Normal heart sounds.  Pulmonary:     Effort: Pulmonary effort is normal. No respiratory distress.     Breath sounds: Normal breath sounds.  Musculoskeletal:        General: Normal range of motion.     Cervical back: Normal range of motion.  Skin:    General: Skin is warm and dry.  Neurological:     General: No focal deficit present.     Mental Status: She is alert and oriented to person, place, and time.     Cranial Nerves: No cranial nerve deficit.     Comments: Patient is grips are equal.  There is no pronator drift.  There is no weakness in her legs.  When I do light touch however she states there is a difference in her left forearm compared to the right.  Also there is a difference in her left lower face compared to the right.  Psychiatric:        Mood and Affect: Mood normal.        Behavior: Behavior normal.        Thought Content: Thought content normal.     ED Results / Procedures / Treatments   Labs (all labs ordered are listed, but only abnormal results are displayed) Results for orders placed or performed during the hospital encounter of 07/25/20  Comprehensive metabolic panel  Result Value Ref Range   Sodium 134 (L) 135 - 145 mmol/L   Potassium 3.3 (L) 3.5 - 5.1 mmol/L    Chloride 100 98 - 111 mmol/L   CO2 24 22 - 32 mmol/L   Glucose, Bld 151 (H) 70 - 99 mg/dL   BUN 7 6 - 20 mg/dL   Creatinine, Ser 1.61 0.44 - 1.00 mg/dL   Calcium 9.2 8.9 - 09.6 mg/dL   Total Protein 7.7 6.5 - 8.1 g/dL   Albumin 4.5 3.5 - 5.0 g/dL   AST 25 15 - 41 U/L   ALT 21 0 - 44 U/L   Alkaline Phosphatase 79 38 - 126 U/L   Total Bilirubin 0.5 0.3 - 1.2 mg/dL   GFR, Estimated >04 >54 mL/min   Anion gap 10 5 - 15  Acetaminophen level  Result Value Ref Range   Acetaminophen (Tylenol), Serum <10 (L) 10 - 30 ug/mL  Salicylate level  Result Value Ref Range   Salicylate Lvl <7.0 (L) 7.0 - 30.0 mg/dL  Ethanol  Result Value Ref Range   Alcohol, Ethyl (B) <10 <10 mg/dL  CBC with Differential  Result Value Ref Range   WBC 13.0 (H) 4.0 - 10.5 K/uL   RBC 4.61 3.87 - 5.11 MIL/uL   Hemoglobin 14.1 12.0 - 15.0 g/dL   HCT 09.8 11.9 - 14.7 %   MCV 87.9 80.0 - 100.0 fL   MCH 30.6 26.0 - 34.0 pg   MCHC 34.8 30.0 - 36.0 g/dL   RDW 82.9 56.2 - 13.0 %   Platelets  335 150 - 400 K/uL   nRBC 0.0 0.0 - 0.2 %   Neutrophils Relative % 72 %   Neutro Abs 9.2 (H) 1.7 - 7.7 K/uL   Lymphocytes Relative 22 %   Lymphs Abs 2.9 0.7 - 4.0 K/uL   Monocytes Relative 4 %   Monocytes Absolute 0.6 0.1 - 1.0 K/uL   Eosinophils Relative 2 %   Eosinophils Absolute 0.3 0.0 - 0.5 K/uL   Basophils Relative 0 %   Basophils Absolute 0.1 0.0 - 0.1 K/uL   Immature Granulocytes 0 %   Abs Immature Granulocytes 0.05 0.00 - 0.07 K/uL  Urinalysis, Routine w reflex microscopic Urine, Catheterized  Result Value Ref Range   Color, Urine STRAW (A) YELLOW   APPearance CLEAR CLEAR   Specific Gravity, Urine 1.009 1.005 - 1.030   pH 7.0 5.0 - 8.0   Glucose, UA NEGATIVE NEGATIVE mg/dL   Hgb urine dipstick NEGATIVE NEGATIVE   Bilirubin Urine NEGATIVE NEGATIVE   Ketones, ur NEGATIVE NEGATIVE mg/dL   Protein, ur NEGATIVE NEGATIVE mg/dL   Nitrite NEGATIVE NEGATIVE   Leukocytes,Ua NEGATIVE NEGATIVE  Urine rapid drug screen  (hosp performed)  Result Value Ref Range   Opiates NONE DETECTED NONE DETECTED   Cocaine POSITIVE (A) NONE DETECTED   Benzodiazepines POSITIVE (A) NONE DETECTED   Amphetamines NONE DETECTED NONE DETECTED   Tetrahydrocannabinol NONE DETECTED NONE DETECTED   Barbiturates NONE DETECTED NONE DETECTED  CBG monitoring, ED  Result Value Ref Range   Glucose-Capillary 127 (H) 70 - 99 mg/dL   Laboratory interpretation all normal except hyperglycemia, mild hypokalemia, leukocytosis, + UDS    EKG EKG Interpretation  Date/Time:  Monday July 25 2020 02:37:08 EST Ventricular Rate:  75 PR Interval:    QRS Duration: 139 QT Interval:  453 QTC Calculation: 506 R Axis:   38 Text Interpretation: Age not entered, assumed to be  54 years old for purpose of ECG interpretation Sinus rhythm Left bundle branch block No old tracing to compare Confirmed by Devoria Albe ([id number]) on 07/25/2020 3:49:00 AM   Radiology CT HEAD CODE STROKE WO CONTRAST  Result Date: 07/25/2020 CLINICAL DATA:  Code stroke. Left-sided facial numbness and tingling. Left head pain. Disoriented. Last known well at 1:45 a.m. EXAM: CT HEAD WITHOUT CONTRAST TECHNIQUE: Contiguous axial images were obtained from the base of the skull through the vertex without intravenous contrast. COMPARISON:  None. FINDINGS: Brain: No acute infarct, hemorrhage, or mass lesion is present. No significant extraaxial fluid collection is present. No significant white matter lesions are present. The ventricles are of normal size. Basal ganglia are within normal limits. Insular ribbon is normal bilaterally. The brainstem and cerebellum are within normal limits. Vascular: No hyperdense vessel or unexpected calcification. Skull: Calvarium is intact. No focal lytic or blastic lesions are present. No significant extracranial soft tissue lesion is present. Sinuses/Orbits: The paranasal sinuses and mastoid air cells are clear. The globes and orbits are within normal  limits. ASPECTS [facility] Stroke Program Early CT Score) - Ganglionic level infarction (caudate, lentiform nuclei, internal capsule, insula, M1-M3 cortex): 3/3 - Supraganglionic infarction (M4-M6 cortex): 7/7 Total score (0-10 with 10 being normal): 10/10 IMPRESSION: 1. Negative CT of the head. 2. ASPECTS is 10/10. These results were called by telephone at the time of interpretation on 07/25/2020 at 3:31 am to provider Meeghan Skipper , who verbally acknowledged these results. Electronically Signed   By: Marin Roberts M.D.   On: 07/25/2020 03:32   CT ANGIO HEAD CODE STROKE  CT ANGIO NECK CODE STROKE  Result Date: 07/25/2020 CLINICAL DATA:  54 year old female code stroke presentation last known well 0145 hours. EXAM: CT ANGIOGRAPHY HEAD AND NECK TECHNIQUE: Multidetector CT imaging of the head and neck was performed using the standard protocol during bolus administration of intravenous contrast. Multiplanar CT image reconstructions and MIPs were obtained to evaluate the vascular anatomy. Carotid stenosis measurements (when applicable) are obtained utilizing NASCET criteria, using the distal internal carotid diameter as the denominator. CONTRAST:  OMNIPAQUE IOHEXOL 350 MG/ML SOLN COMPARISON:  Plain head CT 0317 hours today. FINDINGS: CTA NECK Skeleton: Advanced C4-C5 through C6-C7 cervical disc and endplate degeneration including some vacuum disc. Congenital incomplete ossification of the posterior C1 ring. No acute osseous abnormality identified. Upper chest: Negative. Other neck: Retained secretions in the nasal cavity. Asymmetric atrophy of the right parotid gland. Aortic arch: 3 vessel arch configuration with minimal arch atherosclerosis. Right carotid system: Minimal atherosclerosis at the right ICA origin without stenosis. Left carotid system: Minimal atherosclerosis left ICA bulb. No stenosis. Vertebral arteries: Proximal right subclavian artery and right vertebral artery origin are normal. Right  vertebral artery is patent and normal to the skull base. Minimal to mild atherosclerosis proximal left subclavian artery without stenosis. Normal left vertebral artery origin. Left vertebral artery is patent and normal to the skull base. CTA HEAD Posterior circulation: Codominant distal vertebral arteries are normal to the vertebrobasilar junction. Normal right PICA origin. Left AICA appears dominant and patent. Patent basilar artery without stenosis. Patent SCA and left PCA origins. Fetal right PCA origin. Left posterior communicating artery diminutive or absent. Bilateral PCA branches are within normal limits. Anterior circulation: Both ICA siphons are patent. The right siphon appears mildly dominant. No siphon plaque or stenosis. Normal ophthalmic and right posterior communicating artery origins. Patent carotid termini, MCA and ACA origins. Diminutive anterior communicating artery. Small median artery of the corpus callosum (normal variant). ACA branches are within normal limits. Left MCA M1 segment bifurcates early without stenosis. Left MCA branches are within normal limits. Right MCA M1 segment and bifurcation are patent without stenosis. Right MCA branches are within normal limits. Venous sinuses: Patent. Anatomic variants: Fetal right PCA origin. Review of the MIP images confirms the above findings IMPRESSION: 1. Negative for large vessel occlusion. 2. Minimal atherosclerosis.  No arterial stenosis identified. 3. 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 Electronically Signed   By: Odessa Fleming M.D.   On: 07/25/2020 05:11    Procedures .Critical Care Performed by: Devoria Albe, MD Authorized by: Devoria Albe, MD   Critical care provider statement:    Critical care time (minutes):  32   Critical care was necessary to treat or prevent imminent or life-threatening deterioration of the following conditions:  CNS failure or compromise   Critical care was time spent personally  by me on the following activities:  Discussions with consultants, examination of patient, obtaining history from patient or surrogate, ordering and review of laboratory studies, ordering and review of radiographic studies, re-evaluation of patient's condition and review of old charts   Care discussed with: admitting provider     (including critical care time)  Medications Ordered in ED Medications  aspirin tablet 325 mg (325 mg Oral Given 07/25/20 0409)  iohexol (OMNIPAQUE) 350 MG/ML injection 100 mL (100 mLs Intravenous Contrast Given 07/25/20 0423)    ED Course  I have reviewed the triage vital signs and the nursing notes.  Pertinent labs & imaging results that were available during  my care of the patient were reviewed by me and considered in my medical decision making (see chart for details).    MDM Rules/Calculators/A&P                          After my examination it appears patient may be actually having acute stroke symptoms.  Code stroke was called.  3:34 AM radiology called her CT report.  4:00 AM Dr. Lilian KapurMcDonald, teleneurologist has evaluated patient.  He is not sure if maybe she may have had a seizure.  He recommends getting CTA of her carotids and head and admission for MRI and EEG.  He recommends giving her aspirin 325 mg now and they will consider doing antiseizure medication based on her further imaging studies.  Patient CTA studies have resulted.  I am going to talk to the hospitalist about admission.  5:40 AM Dr. Robb Matarrtiz, hospitalist will admit.  Final Clinical Impression(s) / ED Diagnoses Final diagnoses:  Left arm numbness    Rx / DC Orders  Plan admission  Devoria AlbeIva Cahterine Heinzel, MD, Concha PyoFACEP    Maleni Seyer, MD 07/25/20 [phone]

## 2020-07-25 NOTE — H&P (Signed)
History and Physical    Monica [name] WUX:[id number] DOB: September 02, 1965 DOA: 07/25/2020  PCP: Elfredia Nevins, MD   Patient coming from: Home.   I have personally briefly reviewed patient's old medical records in [facility]  Chief Complaint: Left leg numbness.  HPI: Monica [name] is a 54 y.o. female with medical history significant of anxiety, hyperlipidemia, endometriosis, hypertension, osteoporosis, grade 3 spondylolisthesis, L5-S1 laminectomy/fusion, unspecified tachycardia, vitamin D deficiency who is brought to the emergency department via EMS due to left arm numbness associated with left-sided headache and brief syncopal episodes witnessed by the patient's husband.  The patient states that around 2300 she took 2 Flexeril send the melatonin to go to sleep, which she normally uses, but was unable to fall asleep.  She was in the kitchen talking to her husband around 0145 when she had sudden onset of left sided headache which felt like it was radiating down to her LUE.  She felt something was unusual.  Shortly after this, the patient had some brief episodes of LOC.  Her husband had to avert her from falling.  After she regained consciousness, she noticed that her left side of face and LUE felt numb.  She continued to have a headache, but not as intense.  She denies visual changes, nausea, emesis, slurred speech or gait instability.  No chest pain, palpitations, dizziness, diaphoresis, lower extremity edema, PND or orthopnea.  She denies fever, chills, sore throat, rhinorrhea.  No abdominal pain, diarrhea, constipation, melena or hematochezia.  Denies dysuria, frequency or hematuria.  No polyuria, polydipsia, polyphagia or blurred vision.  ED Course: Initial vital signs were temperature 98 F, pulse 81, respiration 14, blood pressure 107/77 mmHg and O2 sat 100% on room air.  The patient was given aspirin.  Teleneurology was consulted who made recommendations of MRI and EEG.  Labwork:  Urinalysis was unremarkable.  UDS was positive for cocaine and benzodiazepines (the patient denies using cocaine).  CBC showed a white count of 13.0, hemoglobin 14.1 g/dL platelets 253.  Normal PT/INR/PTT.  CMP showed a sodium of 134 and potassium 3.3 mmol/L.  Glucose was 151 mg/dL.  The rest of the chemistry values are unremarkable.  Imaging: CT head, CTA head and CTA neck did not show any acute event.  Review of Systems: As per HPI otherwise all other systems reviewed and are negative.  Past Medical History:  Diagnosis Date  . Anxiety   . Endometriosis   . High cholesterol   . Hypertension   . NSVD (normal spontaneous vaginal delivery)    X2  . Osteoporosis   . Spondylolisthesis, grade 3   . Tachycardia   . Vitamin D deficiency     Past Surgical History:  Procedure Laterality Date  . ABDOMINAL HYSTERECTOMY  2003   LAVH/RSO  . BACK SURGERY  Sep 16 2012   L5-S1 LAMINECTOMY INFUSION  . CERVICAL BIOPSY  W/ LOOP ELECTRODE EXCISION    . CHOLECYSTECTOMY    . TUBAL LIGATION     POSTPARTUM    Social History  reports that she quit smoking about 10 years ago. Her smoking use included cigarettes. She has never used smokeless tobacco. She reports that she does not drink alcohol and does not use drugs.  No Known Allergies  Family History  Problem Relation Age of Onset  . Breast cancer Mother   . Diabetes Father   . Hypertension Father   . Cancer Father        COLON, LYMPHOMA  .  Diabetes Maternal Aunt   . Heart disease Maternal Aunt   . Cancer Paternal Grandfather        COLON   Prior to Admission medications   Medication Sig Start Date End Date Taking? Authorizing Provider  ALPRAZolam (XANAX) 0.25 MG tablet TAKE ONE TABLET BY MOUTH AT BEDTIME AS NEEDED. Patient not taking: Reported on 12/09/2015 11/30/13   Ok EdwardsFernandez, Juan H, MD  aspirin 81 MG tablet Take 81 mg by mouth daily.    [provider]  atorvastatin (LIPITOR) 40 MG tablet Take 40 mg by mouth daily.     [provider]  beta carotene w/minerals (OCUVITE) tablet Take 1 tablet by mouth daily.    [provider]  calcium carbonate (OS-CAL) 600 MG TABS Take 600 mg by mouth 2 (two) times daily with a meal.    [provider]  cyclobenzaprine (FLEXERIL) 10 MG tablet Take 10 mg by mouth 3 (three) times daily as needed.      [provider]  estradiol (ESTRACE) 1 MG tablet Take 1 tablet (1 mg total) by mouth daily. Patient not taking: Reported on 12/09/2015 07/03/11 12/09/15  Ok EdwardsFernandez, Juan H, MD  estrogens, conjugated, (PREMARIN) 0.625 MG tablet Take 1 tablet (0.625 mg total) by mouth daily. Take daily for 21 days then do not take for 7 days. 06/14/11 06/13/12  Ok EdwardsFernandez, Juan H, MD  fish oil-omega-3 fatty acids 1000 MG capsule Take 2 g by mouth daily.    [provider]  gabapentin (NEURONTIN) 100 MG capsule Take 100 mg by mouth 3 (three) times daily. Reported on 12/09/2015    [provider]  HYDROcodone-acetaminophen (VICODIN) 2.5-500 MG per tablet Take 1 tablet by mouth every 6 (six) hours as needed for pain. Reported on 12/09/2015    [provider]  metoprolol (TOPROL-XL) 50 MG 24 hr tablet Take 100 mg by mouth daily.     [provider]  nitrofurantoin, macrocrystal-monohydrate, (MACROBID) 100 MG capsule Take 1 capsule (100 mg total) by mouth 2 (two) times daily. 12/16/15   Ok EdwardsFernandez, Juan H, MD  NONFORMULARY OR COMPOUNDED ITEM Take 1.25 mg by mouth daily. Triest SR (8/1/1)-Methyltestosterone 1.25-1.25mg  caps S: take one daily 07/25/16   Ok EdwardsFernandez, Juan H, MD  Teriparatide, Recombinant, (FORTEO) 600 MCG/2.4ML SOLN Inject 20 mcg into the skin.    [provider]  Vitamin D, Ergocalciferol, (DRISDOL) 50000 units CAPS capsule Take one tablet weekly for 12 weeks then have vitamin d level rechecked at office. 01/02/16   Ok EdwardsFernandez, Juan H, MD  ZANTAC 150 MG tablet TAKE ONE TABLET BY MOUTH IN THE EVENING AFTER DINNER, BEFORE BEDTIME.  11/02/11   Iva BoopGessner, Carl E, MD    Physical Exam: Vitals:   07/25/20 0400 07/25/20 0500 07/25/20 0533 07/25/20 0600  BP: 116/79   119/74  Pulse: 75 80  74  Resp: 17 14 16 12   Temp:    98 F (36.7 C)  TempSrc:    Oral  SpO2: 99% 97%  100%  Weight:    61.2 kg  Height:    4\' 11"  (1.499 m)    Constitutional: NAD, calm, comfortable Eyes: PERRL, lids and conjunctivae normal ENMT: Mucous membranes are moist. Posterior pharynx clear of any exudate or lesions. Neck: normal, supple, no masses, no thyromegaly Respiratory: Clear to auscultation bilaterally, no wheezing, no crackles. Normal respiratory effort. No accessory muscle use.  Cardiovascular: Regular rate and rhythm, no murmurs / rubs / gallops. No extremity edema. 2+ pedal pulses. No carotid bruits.  Abdomen: No distention.  Bowel sounds positive.  Soft, no tenderness, no masses palpated. No hepatosplenomegaly. Musculoskeletal: no clubbing / cyanosis. Good ROM, no contractures. Normal muscle tone.  Skin: no rashes, lesions, ulcers Neurologic: CN 2-12 grossly intact.  Subjective decrease in sensation on LUE, mild dysmetria on LUE nose to finger/finger-to-nose.  DTR normal. Strength 5/5 in all 4.  Psychiatric: Normal judgment and insight. Alert and oriented x 3. Normal mood.   Labs on Admission: I have personally reviewed following labs and imaging studies  CBC: Recent Labs  Lab 07/25/20 0230  WBC 13.0*  NEUTROABS 9.2*  HGB 14.1  HCT 40.5  MCV 87.9  PLT 335    Basic Metabolic Panel: Recent Labs  Lab 07/25/20 0230 07/25/20 0604  NA 134*  --   K 3.3*  --   CL 100  --   CO2 24  --   GLUCOSE 151*  --   BUN 7  --   CREATININE 0.84  --   CALCIUM 9.2  --   MG  --  1.8    GFR: Estimated Creatinine Clearance: 60.2 mL/min (by C-G formula based on SCr of 0.84 mg/dL).  Liver Function Tests: Recent Labs  Lab 07/25/20 0230  AST 25  ALT 21  ALKPHOS 79  BILITOT 0.5  PROT 7.7  ALBUMIN 4.5    Urine analysis:     Component Value Date/Time   COLORURINE STRAW (A) 07/25/2020 0240   APPEARANCEUR CLEAR 07/25/2020 0240   LABSPEC 1.009 07/25/2020 0240   PHURINE 7.0 07/25/2020 0240   GLUCOSEU NEGATIVE 07/25/2020 0240   HGBUR NEGATIVE 07/25/2020 0240   BILIRUBINUR NEGATIVE 07/25/2020 0240   KETONESUR NEGATIVE 07/25/2020 0240   PROTEINUR NEGATIVE 07/25/2020 0240   NITRITE NEGATIVE 07/25/2020 0240   LEUKOCYTESUR NEGATIVE 07/25/2020 0240    Radiological Exams on Admission: CT HEAD CODE STROKE WO CONTRAST  Result Date: 07/25/2020 CLINICAL DATA:  Code stroke. Left-sided facial numbness and tingling. Left head pain. Disoriented. Last known well at 1:45 a.m. EXAM: CT HEAD WITHOUT CONTRAST TECHNIQUE: Contiguous axial images were obtained from the base of the skull through the vertex without intravenous contrast. COMPARISON:  None. FINDINGS: Brain: No acute infarct, hemorrhage, or mass lesion is present. No significant extraaxial fluid collection is present. No significant white matter lesions are present. The ventricles are of normal size. Basal ganglia are within normal limits. Insular ribbon is normal bilaterally. The brainstem and cerebellum are within normal limits. Vascular: No hyperdense vessel or unexpected calcification. Skull: Calvarium is intact. No focal lytic or blastic lesions are present. No significant extracranial soft tissue lesion is present. Sinuses/Orbits: The paranasal sinuses and mastoid air cells are clear. The globes and orbits are within normal limits. ASPECTS [facility] Stroke Program Early CT Score) - Ganglionic level infarction (caudate, lentiform nuclei, internal capsule, insula, M1-M3 cortex): 3/3 - Supraganglionic infarction (M4-M6 cortex): 7/7 Total score (0-10 with 10 being normal): 10/10 IMPRESSION: 1. Negative CT of the head. 2. ASPECTS is 10/10. These results were called by telephone at the time of interpretation on 07/25/2020 at 3:31 am to provider IVA KNAPP , who verbally acknowledged  these results. Electronically Signed   By: Marin Roberts M.D.   On: 07/25/2020 03:32   CT ANGIO HEAD CODE STROKE  Result Date: 07/25/2020 CLINICAL DATA:  54 year old female code stroke presentation last known well 0145 hours. EXAM: CT ANGIOGRAPHY HEAD AND NECK TECHNIQUE: Multidetector CT imaging of the head and neck was performed using the standard protocol during bolus administration of  intravenous contrast. Multiplanar CT image reconstructions and MIPs were obtained to evaluate the vascular anatomy. Carotid stenosis measurements (when applicable) are obtained utilizing NASCET criteria, using the distal internal carotid diameter as the denominator. CONTRAST:  OMNIPAQUE IOHEXOL 350 MG/ML SOLN COMPARISON:  Plain head CT 0317 hours today. FINDINGS: CTA NECK Skeleton: Advanced C4-C5 through C6-C7 cervical disc and endplate degeneration including some vacuum disc. Congenital incomplete ossification of the posterior C1 ring. No acute osseous abnormality identified. Upper chest: Negative. Other neck: Retained secretions in the nasal cavity. Asymmetric atrophy of the right parotid gland. Aortic arch: 3 vessel arch configuration with minimal arch atherosclerosis. Right carotid system: Minimal atherosclerosis at the right ICA origin without stenosis. Left carotid system: Minimal atherosclerosis left ICA bulb. No stenosis. Vertebral arteries: Proximal right subclavian artery and right vertebral artery origin are normal. Right vertebral artery is patent and normal to the skull base. Minimal to mild atherosclerosis proximal left subclavian artery without stenosis. Normal left vertebral artery origin. Left vertebral artery is patent and normal to the skull base. CTA HEAD Posterior circulation: Codominant distal vertebral arteries are normal to the vertebrobasilar junction. Normal right PICA origin. Left AICA appears dominant and patent. Patent basilar artery without stenosis. Patent SCA and left PCA origins.  Fetal right PCA origin. Left posterior communicating artery diminutive or absent. Bilateral PCA branches are within normal limits. Anterior circulation: Both ICA siphons are patent. The right siphon appears mildly dominant. No siphon plaque or stenosis. Normal ophthalmic and right posterior communicating artery origins. Patent carotid termini, MCA and ACA origins. Diminutive anterior communicating artery. Small median artery of the corpus callosum (normal variant). ACA branches are within normal limits. Left MCA M1 segment bifurcates early without stenosis. Left MCA branches are within normal limits. Right MCA M1 segment and bifurcation are patent without stenosis. Right MCA branches are within normal limits. Venous sinuses: Patent. Anatomic variants: Fetal right PCA origin. Review of the MIP images confirms the above findings IMPRESSION: 1. Negative for large vessel occlusion. 2. Minimal atherosclerosis.  No arterial stenosis identified. 3. 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 Electronically Signed   By: Odessa Fleming M.D.   On: 07/25/2020 05:11   CT ANGIO NECK CODE STROKE  Result Date: 07/25/2020 CLINICAL DATA:  54 year old female code stroke presentation last known well 0145 hours. EXAM: CT ANGIOGRAPHY HEAD AND NECK TECHNIQUE: Multidetector CT imaging of the head and neck was performed using the standard protocol during bolus administration of intravenous contrast. Multiplanar CT image reconstructions and MIPs were obtained to evaluate the vascular anatomy. Carotid stenosis measurements (when applicable) are obtained utilizing NASCET criteria, using the distal internal carotid diameter as the denominator. CONTRAST:  OMNIPAQUE IOHEXOL 350 MG/ML SOLN COMPARISON:  Plain head CT 0317 hours today. FINDINGS: CTA NECK Skeleton: Advanced C4-C5 through C6-C7 cervical disc and endplate degeneration including some vacuum disc. Congenital incomplete ossification of the posterior C1  ring. No acute osseous abnormality identified. Upper chest: Negative. Other neck: Retained secretions in the nasal cavity. Asymmetric atrophy of the right parotid gland. Aortic arch: 3 vessel arch configuration with minimal arch atherosclerosis. Right carotid system: Minimal atherosclerosis at the right ICA origin without stenosis. Left carotid system: Minimal atherosclerosis left ICA bulb. No stenosis. Vertebral arteries: Proximal right subclavian artery and right vertebral artery origin are normal. Right vertebral artery is patent and normal to the skull base. Minimal to mild atherosclerosis proximal left subclavian artery without stenosis. Normal left vertebral artery origin. Left vertebral artery is patent  and normal to the skull base. CTA HEAD Posterior circulation: Codominant distal vertebral arteries are normal to the vertebrobasilar junction. Normal right PICA origin. Left AICA appears dominant and patent. Patent basilar artery without stenosis. Patent SCA and left PCA origins. Fetal right PCA origin. Left posterior communicating artery diminutive or absent. Bilateral PCA branches are within normal limits. Anterior circulation: Both ICA siphons are patent. The right siphon appears mildly dominant. No siphon plaque or stenosis. Normal ophthalmic and right posterior communicating artery origins. Patent carotid termini, MCA and ACA origins. Diminutive anterior communicating artery. Small median artery of the corpus callosum (normal variant). ACA branches are within normal limits. Left MCA M1 segment bifurcates early without stenosis. Left MCA branches are within normal limits. Right MCA M1 segment and bifurcation are patent without stenosis. Right MCA branches are within normal limits. Venous sinuses: Patent. Anatomic variants: Fetal right PCA origin. Review of the MIP images confirms the above findings IMPRESSION: 1. Negative for large vessel occlusion. 2. Minimal atherosclerosis.  No arterial stenosis  identified. 3. 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 Electronically Signed   By: Odessa Fleming M.D.   On: 07/25/2020 05:11    EKG: Independently reviewed.  Vent. rate 75 BPM PR interval * ms QRS duration 139 ms QT/QTc 453/506 ms P-R-T axes 81 38 237 Sinus rhythm Left bundle branch block No old tracing to compare  Assessment/Plan Principal Problem:   Left arm numbness Observation/telemetry. Frequent neuro checks. Swallow screen. PT/OT/SLP. Aspirin. Fasting lipids. Hemoglobin A1c. Obtain echocardiogram. Thank MRI of brain without contrast. Adult EEG. Consult neurology on arrival to [facility].  Active Problems:   GERD Famotidine 20 mg p.o. twice daily.    Hypercholesterolemia Check fasting lipids. Continue atorvastatin.    Hypokalemia Replacing. Follow-up potassium level.    Hypertension  Hold antihypertensives. Monitor blood pressure. Allow permissive hypertension.    Hyperglycemia Check hemoglobin A1c.    Tobacco use  Nicotine replacement therapy as needed. Staff to provide tobacco cessation information.    Positive UDS The patient denies using recreational drugs.    DVT prophylaxis: Lovenox SQ. Code Status:   Full code. Family Communication:   Disposition Plan:   Patient is from:  Home.  Anticipated DC to:  Home.  Anticipated DC date:  07/27/2020.  Anticipated DC barriers: Pending work-up/neurology consult.  Consults called: Admission status:  Observation/telemetry.  Severity of Illness:  High due to sudden onset worsening of chronic headache with LOC and left arm weakness.  Patient will need to stay in the hospital for CVA work-up.  Bobette Mo MD [facility]  How to contact the [facility] Attending or Consulting provider 7A - 7P or covering provider during after hours 7P -7A, for this patient?   1. Check the care team in [facility] and look for a) attending/consulting [facility] provider listed and b) the [facility] team  listed 2. Log into [email or web address] and use [place]'s universal password to access. If you do not have the password, please contact the hospital operator. 3. Locate the [facility] provider you are looking for under [facility] and page to a number that you can be directly reached. 4. If you still have difficulty reaching the provider, please page the [facility] (Director on Call) for the Hospitalists listed on amion for assistance.  07/25/2020, 6:30 AM   This document was prepared using Dragon voice recognition software and may contain some unintended transcription errors.

## 2020-07-26 NOTE — Procedures (Signed)
Patient Name: Monica [name]  MRN: [id number]  Epilepsy Attending: Charlsie Quest  Referring Physician/Provider: Dr. Sanda Klein Date: 07/25/2020 Duration: 25.53 minutes  Patient history: 67 old female with syncope and left arm numbness.  EEG done for seizures.  Level of alertness: Awake  AEDs during EEG study: None  Technical aspects: This EEG study was done with scalp electrodes positioned according to the 10-20 International system of electrode placement. Electrical activity was acquired at a sampling rate of 500Hz  and reviewed with a high frequency filter of 70Hz  and a low frequency filter of 1Hz . EEG data were recorded continuously and digitally stored.   Description: The posterior dominant rhythm consists of 9 Hz activity of moderate voltage (25-35 uV) seen predominantly in posterior head regions, symmetric and reactive to eye opening and eye closing. There is an excessive amount of 15 to 18 Hz beta activity distributed symmetrically and diffusely.   Physiologic photic driving was seen during photic stimulation.  Hyperventilation was not performed.     ABNORMALITY -Excessive beta, generalized  IMPRESSION: This study is within normal limits. The excessive beta activity seen in the background is most likely due to the effect of benzodiazepine and is a benign EEG pattern. No seizures or epileptiform discharges were seen throughout the recording.  Eliaz Fout 

## 2020-08-25 ENCOUNTER — Encounter: Payer: Self-pay | Admitting: Internal Medicine

## 2020-12-06 ENCOUNTER — Other Ambulatory Visit (HOSPITAL_COMMUNITY): Payer: Self-pay | Admitting: Physician Assistant

## 2020-12-06 DIAGNOSIS — Z1231 Encounter for screening mammogram for malignant neoplasm of breast: Secondary | ICD-10-CM

## 2020-12-14 ENCOUNTER — Ambulatory Visit (HOSPITAL_COMMUNITY)
Admission: RE | Admit: 2020-12-14 | Discharge: 2020-12-14 | Disposition: A | Discharge status: Home / Self Care | Payer: 59 | Source: Ambulatory Visit | Attending: Physician Assistant | Admitting: Physician Assistant

## 2020-12-14 DIAGNOSIS — Z1231 Encounter for screening mammogram for malignant neoplasm of breast: Secondary | ICD-10-CM | POA: Diagnosis not present

## 2021-01-15 IMAGING — CT CT ANGIO HEAD-NECK
3 of 8 series · 10 of 36 positions shown · IV contrast (Omnipaque or Isovue)
Comparison: Plain head CT 1753 hours today.

CLINICAL DATA: 54-year-old female code stroke presentation last
known well 5771 hours.

EXAM:
CT ANGIOGRAPHY HEAD AND NECK
TECHNIQUE: Multidetector CT imaging of the head and neck was performed using
the standard protocol during bolus administration of intravenous
contrast. Multiplanar CT image reconstructions and MIPs were
obtained to evaluate the vascular anatomy. Carotid stenosis
measurements (when applicable) are obtained utilizing NASCET
criteria, using the distal internal carotid diameter as the
denominator.
CONTRAST:  100mL OMNIPAQUE IOHEXOL 350 MG/ML SOLN

[Series 7: cta head & neck · axial · 0.41mm/px · z∈[-137,+89]mm · 6 of 640 slices shown]
[im 92/640  soft-tissue]
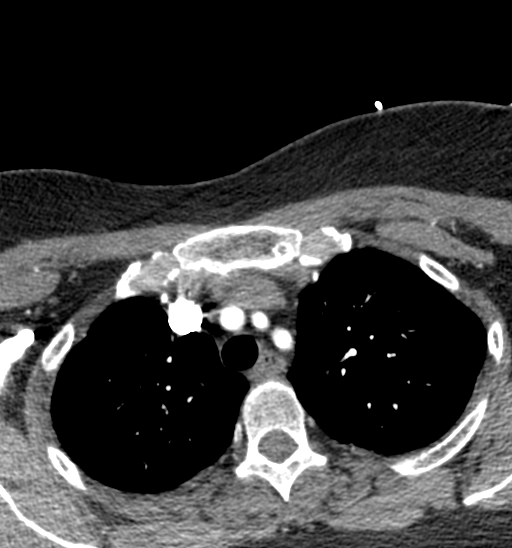
[im 183/640  bone]
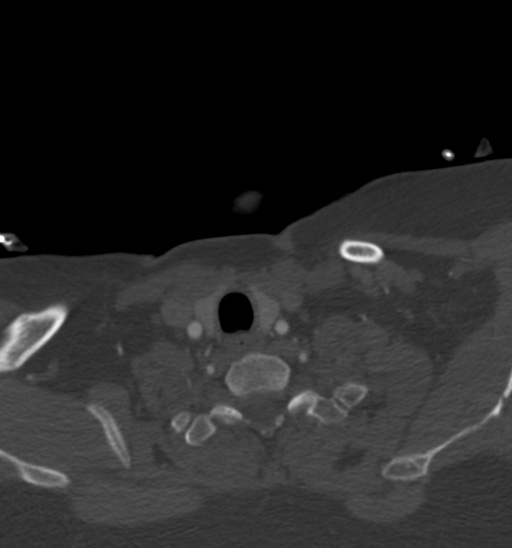
[im 274/640  soft-tissue]
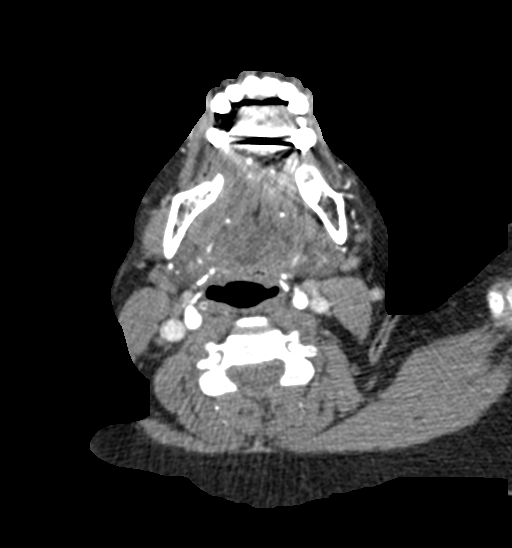
[im 366/640  bone]
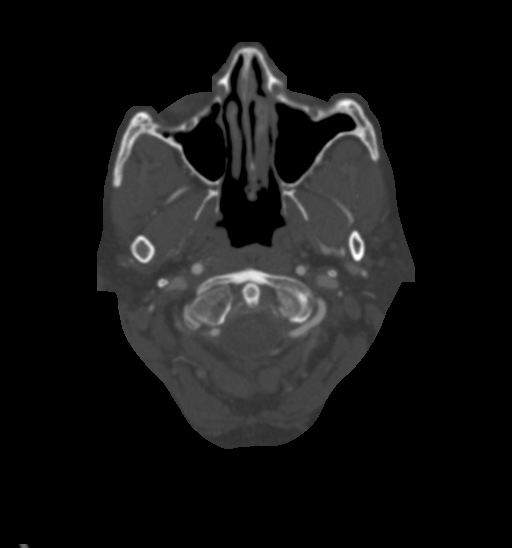
[im 457/640  soft-tissue]
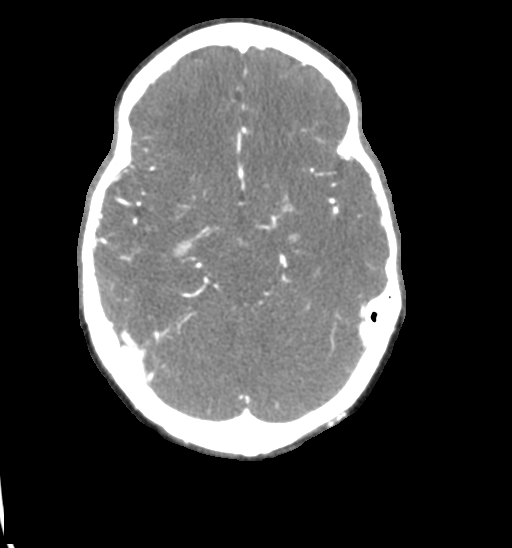
[im 548/640  bone]
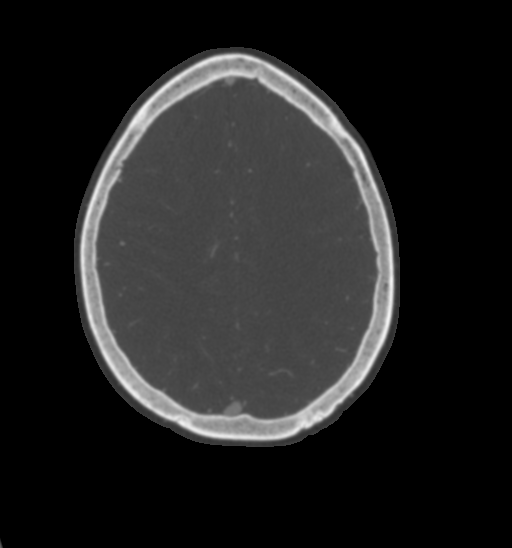

[Series 8: ax thin · axial · 0.39mm/px · z∈[-64,+42]mm · 2 of 320 slices shown]
[im 107/320  soft-tissue]
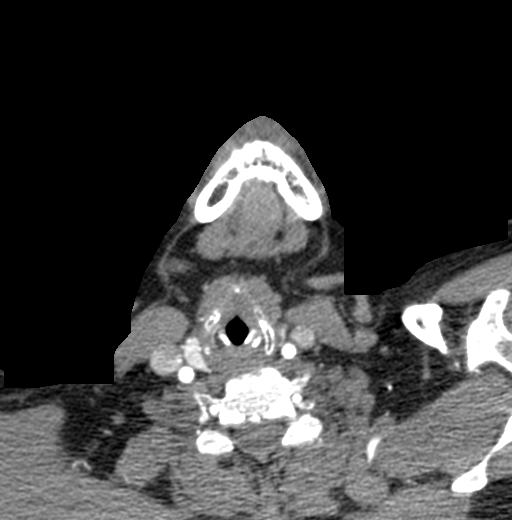
[im 213/320  soft-tissue]
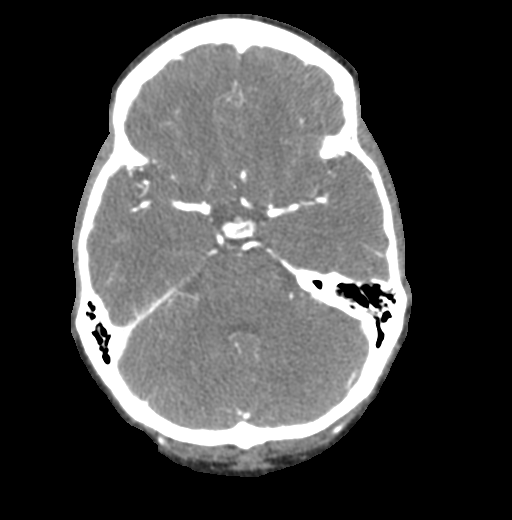

[Series 10: sag thin · sagittal · 0.49mm/px · 2 of 212 slices shown]
[im 35/212  soft-tissue]
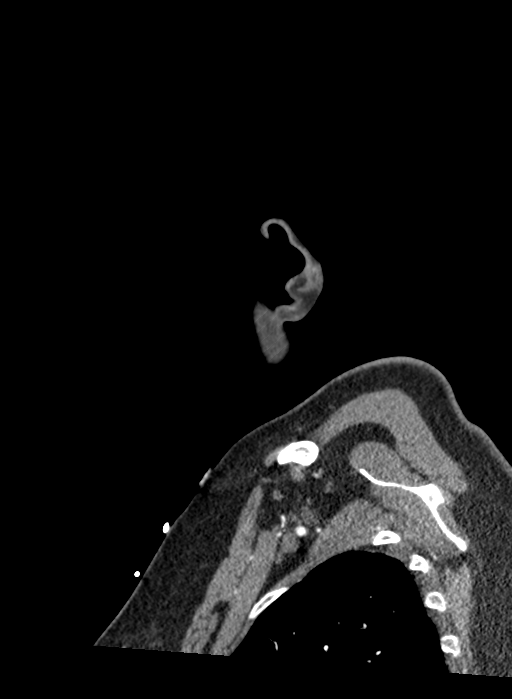
[im 152/212  soft-tissue]
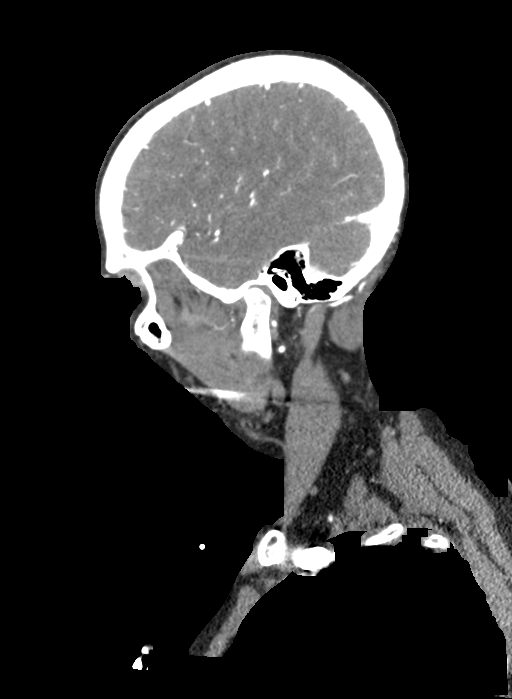

[10 of 36 positions shown; findings below may reference images not displayed]

FINDINGS: CTA NECK

Skeleton: Advanced C4-C5 through C6-C7 cervical disc and endplate
degeneration including some vacuum disc. Congenital incomplete
ossification of the posterior C1 ring. No acute osseous abnormality
identified.

Upper chest: Negative.

Other neck: Retained secretions in the nasal cavity. Asymmetric
atrophy of the right parotid gland.

Aortic arch: 3 vessel arch configuration with minimal arch
atherosclerosis.

Right carotid system: Minimal atherosclerosis at the right ICA
origin without stenosis.

Left carotid system: Minimal atherosclerosis left ICA bulb. No
stenosis.

Vertebral arteries:
Proximal right subclavian artery and right vertebral artery origin
are normal. Right vertebral artery is patent and normal to the skull
base.

Minimal to mild atherosclerosis proximal left subclavian artery
without stenosis. Normal left vertebral artery origin. Left
vertebral artery is patent and normal to the skull base.

CTA HEAD

Posterior circulation: Codominant distal vertebral arteries are
normal to the vertebrobasilar junction. Normal right PICA origin.
Left AICA appears dominant and patent. Patent basilar artery without
stenosis. Patent SCA and left PCA origins. Fetal right PCA origin.
Left posterior communicating artery diminutive or absent. Bilateral
PCA branches are within normal limits.

Anterior circulation: Both ICA siphons are patent. The right siphon
appears mildly dominant. No siphon plaque or stenosis. Normal
ophthalmic and right posterior communicating artery origins.

Patent carotid termini, MCA and ACA origins. Diminutive anterior
communicating artery. Small median artery of the corpus callosum
(normal variant). ACA branches are within normal limits.

Left MCA M1 segment bifurcates early without stenosis. Left MCA
branches are within normal limits.

Right MCA M1 segment and bifurcation are patent without stenosis.
Right MCA branches are within normal limits.

Venous sinuses: Patent.

Anatomic variants: Fetal right PCA origin.

Review of the MIP images confirms the above findings
IMPRESSION: 1. Negative for large vessel occlusion.
2. Minimal atherosclerosis.  No arterial stenosis identified.
3. Advanced cervical spine C4-C5 through C6-C7 disc and endplate
degeneration.

## 2021-03-07 DIAGNOSIS — K219 Gastro-esophageal reflux disease without esophagitis: Secondary | ICD-10-CM | POA: Diagnosis not present

## 2021-03-07 DIAGNOSIS — I10 Essential (primary) hypertension: Secondary | ICD-10-CM | POA: Diagnosis not present

## 2021-03-07 DIAGNOSIS — J310 Chronic rhinitis: Secondary | ICD-10-CM | POA: Diagnosis not present

## 2021-03-07 DIAGNOSIS — J329 Chronic sinusitis, unspecified: Secondary | ICD-10-CM | POA: Diagnosis not present

## 2021-03-07 DIAGNOSIS — E663 Overweight: Secondary | ICD-10-CM | POA: Diagnosis not present

## 2021-03-07 DIAGNOSIS — Z6826 Body mass index (BMI) 26.0-26.9, adult: Secondary | ICD-10-CM | POA: Diagnosis not present

## 2021-03-14 DIAGNOSIS — S93402A Sprain of unspecified ligament of left ankle, initial encounter: Secondary | ICD-10-CM | POA: Diagnosis not present

## 2021-07-31 DIAGNOSIS — J0110 Acute frontal sinusitis, unspecified: Secondary | ICD-10-CM | POA: Diagnosis not present

## 2021-12-05 DIAGNOSIS — K219 Gastro-esophageal reflux disease without esophagitis: Secondary | ICD-10-CM | POA: Diagnosis not present

## 2021-12-05 DIAGNOSIS — I10 Essential (primary) hypertension: Secondary | ICD-10-CM | POA: Diagnosis not present

## 2021-12-05 DIAGNOSIS — Z6826 Body mass index (BMI) 26.0-26.9, adult: Secondary | ICD-10-CM | POA: Diagnosis not present

## 2021-12-05 DIAGNOSIS — E663 Overweight: Secondary | ICD-10-CM | POA: Diagnosis not present

## 2022-04-17 DIAGNOSIS — S93402A Sprain of unspecified ligament of left ankle, initial encounter: Secondary | ICD-10-CM | POA: Diagnosis not present

## 2022-04-17 DIAGNOSIS — M25572 Pain in left ankle and joints of left foot: Secondary | ICD-10-CM | POA: Diagnosis not present

## 2022-04-17 DIAGNOSIS — Z6827 Body mass index (BMI) 27.0-27.9, adult: Secondary | ICD-10-CM | POA: Diagnosis not present

## 2022-09-02 ENCOUNTER — Other Ambulatory Visit: Payer: Self-pay

## 2022-09-02 ENCOUNTER — Emergency Department (HOSPITAL_COMMUNITY): Payer: BC Managed Care – PPO

## 2022-09-02 ENCOUNTER — Encounter (HOSPITAL_COMMUNITY): Payer: Self-pay

## 2022-09-02 ENCOUNTER — Emergency Department (HOSPITAL_COMMUNITY)
Admission: EM | Admit: 2022-09-02 | Discharge: 2022-09-02 | Disposition: A | Discharge status: Home / Self Care | Payer: BC Managed Care – PPO | Attending: Emergency Medicine | Admitting: Emergency Medicine

## 2022-09-02 DIAGNOSIS — M7989 Other specified soft tissue disorders: Secondary | ICD-10-CM | POA: Diagnosis not present

## 2022-09-02 DIAGNOSIS — Z1152 Encounter for screening for COVID-19: Secondary | ICD-10-CM | POA: Diagnosis not present

## 2022-09-02 DIAGNOSIS — L03011 Cellulitis of right finger: Secondary | ICD-10-CM | POA: Insufficient documentation

## 2022-09-02 DIAGNOSIS — Z7982 Long term (current) use of aspirin: Secondary | ICD-10-CM | POA: Diagnosis not present

## 2022-09-02 DIAGNOSIS — R030 Elevated blood-pressure reading, without diagnosis of hypertension: Secondary | ICD-10-CM

## 2022-09-02 DIAGNOSIS — I10 Essential (primary) hypertension: Secondary | ICD-10-CM | POA: Diagnosis not present

## 2022-09-02 DIAGNOSIS — M19041 Primary osteoarthritis, right hand: Secondary | ICD-10-CM | POA: Diagnosis not present

## 2022-09-02 LAB — CBC WITH DIFFERENTIAL/PLATELET
Abs Immature Granulocytes: 0.01 10*3/uL (ref 0.00–0.07)
Basophils Absolute: 0 10*3/uL (ref 0.0–0.1)
Basophils Relative: 1 %
Eosinophils Absolute: 0.1 10*3/uL (ref 0.0–0.5)
Eosinophils Relative: 1 %
HCT: 42 % (ref 36.0–46.0)
Hemoglobin: 14.2 g/dL (ref 12.0–15.0)
Immature Granulocytes: 0 %
Lymphocytes Relative: 36 %
Lymphs Abs: 1.8 10*3/uL (ref 0.7–4.0)
MCH: 30.1 pg (ref 26.0–34.0)
MCHC: 33.8 g/dL (ref 30.0–36.0)
MCV: 89 fL (ref 80.0–100.0)
Monocytes Absolute: 0.4 10*3/uL (ref 0.1–1.0)
Monocytes Relative: 8 %
Neutro Abs: 2.6 10*3/uL (ref 1.7–7.7)
Neutrophils Relative %: 54 %
Platelets: 258 10*3/uL (ref 150–400)
RBC: 4.72 MIL/uL (ref 3.87–5.11)
RDW: 13 % (ref 11.5–15.5)
WBC: 4.9 10*3/uL (ref 4.0–10.5)
nRBC: 0 % (ref 0.0–0.2)

## 2022-09-02 LAB — BASIC METABOLIC PANEL
Anion gap: 9 (ref 5–15)
BUN: 6 mg/dL (ref 6–20)
CO2: 24 mmol/L (ref 22–32)
Calcium: 9.3 mg/dL (ref 8.9–10.3)
Chloride: 103 mmol/L (ref 98–111)
Creatinine, Ser: 0.73 mg/dL (ref 0.44–1.00)
GFR, Estimated: >60 mL/min (ref >60)
Glucose, Bld: 101 mg/dL — ABNORMAL HIGH (ref 70–99)
Potassium: 4.1 mmol/L (ref 3.5–5.1)
Sodium: 136 mmol/L (ref 135–145)

## 2022-09-02 LAB — RESP PANEL BY RT-PCR (RSV, FLU A&B, COVID)  RVPGX2
Influenza A by PCR: NEGATIVE
Influenza B by PCR: NEGATIVE
Resp Syncytial Virus by PCR: NEGATIVE
SARS Coronavirus 2 by RT PCR: NEGATIVE

## 2022-09-02 MED ORDER — ACETAMINOPHEN 500 MG PO TABS
1000.0000 mg | ORAL_TABLET | Freq: Once | ORAL | Status: DC
  Filled 2022-09-02: qty 2

## 2022-09-02 MED ORDER — AMOXICILLIN-POT CLAVULANATE 875-125 MG PO TABS
1.0000 | ORAL_TABLET | Freq: Once | ORAL | Status: DC
  Filled 2022-09-02: qty 1

## 2022-09-02 MED ORDER — AMOXICILLIN-POT CLAVULANATE 875-125 MG PO TABS: 1.0000 | ORAL_TABLET | Freq: Two times a day (BID) | ORAL | 0 refills | Status: AC

## 2022-09-02 MED ORDER — LIDOCAINE-EPINEPHRINE 2 %-1:200000 IJ SOLN
20.0000 mL | Freq: Once | INTRAMUSCULAR | Status: AC
Start: 2022-09-02 — End: 2022-09-02
  Administered 2022-09-02: 20 mL
  Filled 2022-09-02: qty 20

## 2022-09-02 MED ORDER — FLUCONAZOLE 200 MG PO TABS: 200.0000 mg | ORAL_TABLET | Freq: Once | ORAL | 0 refills | Status: AC

## 2022-09-02 NOTE — Discharge Instructions (Addendum)
It was our pleasure to provide your ER care today - we hope that you feel better. Your lab work looks good/normal.   Keep area very clean.    Take antibiotic as prescribed.   Follow up with hand specialist in one week if symptoms fail to improve/resolve.  Your blood pressure is high today - continue meds and f/u with primary care doctor in 1-2 weeks.   Return to ER if worse, new symptoms, fevers, spreading redness, severe swelling/pain, or other concern.

## 2022-09-02 NOTE — ED Provider Notes (Addendum)
[place] EMERGENCY DEPARTMENT AT [facility] Provider Note   CSN: [id number] Arrival date & time: 09/02/22  1748     History  Chief Complaint  Patient presents with   Wound Infection    Monica [name] is a 57 y.o. female.  Pt with recurrent infection to distal aspect right thumb. 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 States then was placed on additional round of antibiotics which she completed ~ 3 days ago. Indicates area did get some better w tx but never resolved. Now w erythema/swelling around proximal and lateral aspect nail of thumb. Is right hand dominant. No pain, swelling or streaking proximally up digit, hand or arm. No fevers. Tetanus up to date.   The history is provided by the patient and medical records.       Home Medications Prior to Admission medications   Medication Sig Start Date End Date Taking? Authorizing Provider  ALPRAZolam Duanne Moron) 0.25 MG tablet TAKE ONE TABLET BY MOUTH AT BEDTIME AS NEEDED. 11/30/13   Terrance Mass, MD  amitriptyline (ELAVIL) 10 MG tablet Take 10-30 mg by mouth at bedtime. 07/15/20   [provider]  amLODipine (NORVASC) 5 MG tablet Take 5 mg by mouth daily. 06/30/20   [provider]  aspirin 81 MG tablet Take 81 mg by mouth daily.    [provider]  Cholecalciferol (VITAMIN D3 PO) Take 1 tablet by mouth daily.    [provider]  Coenzyme Q10 (COQ10 PO) Take 1 tablet by mouth daily. Along with crestor    [provider]  cyclobenzaprine (FLEXERIL) 10 MG tablet Take 10 mg by mouth 3 (three) times daily as needed for muscle spasms.    [provider]  fish oil-omega-3 fatty acids 1000 MG capsule Take 2 g by mouth daily.    [provider]  rosuvastatin (CRESTOR) 10 MG tablet Take 10 mg by mouth daily. 06/30/20   [provider]       Allergies    Patient has no known allergies.    Review of Systems   Review of Systems  Constitutional:  Negative for chills and fever.  Musculoskeletal:        Right thumb pain/swelling distally.   Skin:  Negative for rash.  Neurological:  Negative for numbness.    Physical Exam Updated Vital Signs BP (!) 165/97 (BP Location: Right Arm)   Pulse 93   Temp 98.5 F (36.9 C) (Oral)   Resp 18   Wt 61 kg   SpO2 100%   BMI 27.16 kg/m  Physical Exam Vitals and nursing note reviewed.  Constitutional:      Appearance: Normal appearance. She is well-developed.  HENT:     Head: Atraumatic.     Nose: Nose normal.  Eyes:     General: No scleral icterus.    Conjunctiva/sclera: Conjunctivae normal.  Neck:     Trachea: No tracheal deviation.  Cardiovascular:     Rate and Rhythm: Normal rate.     Pulses: Normal pulses.  Pulmonary:     Effort: Pulmonary effort is normal. No respiratory distress.  Musculoskeletal:     Cervical back: Neck supple. No muscular tenderness.     Comments: Paronychia right thumb. No pain w passive rom digit. No tenosynovitis. Hand and forearm appear normal. Radial pulse 2+. Normal cap refill distally.  Skin:    General: Skin is warm and dry.     Findings: No rash.  Neurological:     Mental Status: She is alert.     Comments: Alert, speech normal.   Psychiatric:        Mood and Affect: Mood normal.     ED Results / Procedures / Treatments   Labs (all labs ordered are listed, but only abnormal results are displayed) Results for orders placed or performed during the hospital encounter of 09/02/22  Resp panel by RT-PCR (RSV, Flu A&B, Covid) Anterior Nasal Swab   Specimen: Anterior Nasal Swab  Result Value Ref Range   SARS Coronavirus 2 by RT PCR NEGATIVE NEGATIVE   Influenza A by PCR NEGATIVE NEGATIVE   Influenza B by PCR NEGATIVE NEGATIVE   Resp Syncytial Virus by PCR NEGATIVE NEGATIVE  CBC with Differential  Result Value Ref Range   WBC 4.9  4.0 - 10.5 K/uL   RBC 4.72 3.87 - 5.11 MIL/uL   Hemoglobin 14.2 12.0 - 15.0 g/dL   HCT 42.0 36.0 - 46.0 %   MCV 89.0 80.0 - 100.0 fL   MCH 30.1 26.0 - 34.0 pg   MCHC 33.8 30.0 - 36.0 g/dL   RDW 13.0 11.5 - 15.5 %   Platelets 258 150 - 400 K/uL   nRBC 0.0 0.0 - 0.2 %   Neutrophils Relative % 54 %   Neutro Abs 2.6 1.7 - 7.7 K/uL   Lymphocytes Relative 36 %   Lymphs Abs 1.8 0.7 - 4.0 K/uL   Monocytes Relative 8 %   Monocytes Absolute 0.4 0.1 - 1.0 K/uL   Eosinophils Relative 1 %   Eosinophils Absolute 0.1 0.0 - 0.5 K/uL   Basophils Relative 1 %   Basophils Absolute 0.0 0.0 - 0.1 K/uL   Immature Granulocytes 0 %   Abs Immature Granulocytes 0.01 0.00 - 0.07 K/uL  Basic metabolic panel  Result Value Ref Range   Sodium 136 135 - 145 mmol/L   Potassium 4.1 3.5 - 5.1 mmol/L   Chloride 103 98 - 111 mmol/L   CO2 24 22 - 32 mmol/L   Glucose, Bld 101 (H) 70 - 99 mg/dL   BUN 6 6 - 20 mg/dL   Creatinine, Ser 0.73 0.44 - 1.00 mg/dL   Calcium 9.3 8.9 - 10.3 mg/dL   GFR, Estimated >60 >60 mL/min   Anion gap 9 5 - 15   DG Finger Thumb Right  Result Date: 09/02/2022 CLINICAL DATA:  Worsening paronychia despite incision and drainage and antibiotic therapy EXAM: RIGHT THUMB 2+V COMPARISON:  None Available. FINDINGS: Frontal, oblique, and lateral views of the right thumb are obtained. Soft tissue swelling is seen within the distal first digit at the nail bed consistent with history of paronychia. No subcutaneous gas or radiopaque foreign body. There is no underlying bony destruction to suggest osteomyelitis. Moderate osteoarthritis of the interphalangeal joint. IMPRESSION: 1. Distal soft tissue swelling.  No evidence of osteomyelitis. 2. 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 Electronically Signed   By: Randa Ngo M.D.   On: 09/02/2022 18:34    EKG None  Radiology DG Finger Thumb Right  Result Date: 09/02/2022 CLINICAL DATA:  Worsening paronychia despite incision  and drainage and antibiotic therapy EXAM: RIGHT THUMB 2+V COMPARISON:  None Available. FINDINGS: Frontal, oblique, and lateral views of the right thumb are obtained. Soft tissue swelling is seen within the distal first digit at the nail bed consistent with history  of paronychia. No subcutaneous gas or radiopaque foreign body. There is no underlying bony destruction to suggest osteomyelitis. Moderate osteoarthritis of the interphalangeal joint. IMPRESSION: 1. Distal soft tissue swelling.  No evidence of osteomyelitis. 2. 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 Electronically Signed   By: Randa Ngo M.D.   On: 09/02/2022 18:34    Procedures .Marland KitchenIncision and Drainage  Date/Time: 09/02/2022 7:54 PM  Performed by: Lajean Saver, MD Authorized by: Lajean Saver, MD   Consent:    Consent given by:  Patient Location:    Type:  Abscess (paronychia)   Location:  Upper extremity   Upper extremity location:  Finger   Finger location:  R thumb Pre-procedure details:    Skin preparation:  Chlorhexidine with alcohol Anesthesia:    Anesthesia method:  Local infiltration (digit block)   Local anesthetic:  Lidocaine 2% WITH epi (3 cc used (anesthesia was complete)) Procedure type:    Complexity:  Complex Procedure details:    Incision type: incision along proximal and lateral border of nail into pus pocket(s)   Incision depth:  Subcutaneous   Wound management:  Probed and deloculated   Drainage:  Purulent   Drainage amount:  Moderate   Wound treatment: sterile dressing. Post-procedure details:    Procedure completion:  Tolerated well, no immediate complications     Medications Ordered in ED Medications  lidocaine-EPINEPHrine (XYLOCAINE W/EPI) 2 %-1:200000 (PF) injection 20 mL (has no administration in time range)    ED Course/ Medical Decision Making/ A&P                             Medical Decision Making Problems Addressed: Elevated blood pressure reading:  acute illness or injury Essential hypertension: chronic illness or injury with exacerbation, progression, or side effects of treatment that poses a threat to life or bodily functions Paronychia of right thumb: acute illness or injury with systemic symptoms  Amount and/or Complexity of Data Reviewed Independent Historian:     Details: Family, hx External Data Reviewed: notes. Labs: ordered. Decision-making details documented in ED Course. Radiology: ordered and independent interpretation performed. Decision-making details documented in ED Course.  Risk OTC drugs. Prescription drug management.    Labs ordered/sent. Imaging ordered.    Reviewed nursing notes and prior charts for additional history. External reports reviewed.   Labs reviewed/interpreted by me - wbc normal.   Xrays reviewed/interpreted by me - no fx or fb.   I and D performed.   Sterile dressing. Pt tolerated very well.   Post procedure finger/finger tip or normal color, warmth, and appearance.   Given recurrent infection, issues, although felt well drained in ED, will also rx abx and hand f/u.  Return precautions provided.   Pt request rx fluconazole be sent to pharmacy if gets yeast vaginitis from abx - sent.          Final Clinical Impression(s) / ED Diagnoses Final diagnoses:  None    Rx / DC Orders ED Discharge Orders     None         Lajean Saver, MD 09/02/22 2153

## 2022-09-02 NOTE — ED Notes (Signed)
Suture cart at patient bedside. 

## 2022-09-02 NOTE — ED Triage Notes (Signed)
C/o redness and swelling to right thumb x1 month.  Pt reports I&D performed with 10 day course of bactrim and Augmentin w/o relief was then started on keflex and clindamycin 10 day course w/o relief.  Patient reports abt completed 3 days ago.  Pt reports fever chills, nausea, bodyaches, fatigue.

## 2022-09-02 NOTE — ED Provider Triage Note (Signed)
Emergency Medicine Provider Triage Evaluation Note  EMAAN GARY , a 57 y.o. female  was evaluated in triage.  Pt complains of right thumb infection ongoing over the past 1-1/2 months.  She has had incision and drainage done.  She has been on 3 courses of antibiotics with fluctuating symptoms.  She states over the past several days she has felt worse with generalized bodyaches, fatigue.  She is concerned about a bloodstream infection.  Review of Systems  Positive: Fatigue and malaise Negative: Fever  Physical Exam  BP (!) 165/97 (BP Location: Right Arm)   Pulse 93   Temp 98.5 F (36.9 C) (Oral)   Resp 18   Wt 61 kg   SpO2 100%   BMI 27.16 kg/m  Gen:   Awake, no distress   Resp:  Normal effort  MSK:   Moves extremities without difficulty  Other:  Right thumb, paronychia noted  Medical Decision Making  Medically screening exam initiated at 6:11 PM.  Appropriate orders placed.  Tiffnay D Wren was informed that the remainder of the evaluation will be completed by another provider, this initial triage assessment does not replace that evaluation, and the importance of remaining in the ED until their evaluation is complete.     Carlisle Cater, PA-C 09/02/22 1812

## 2022-09-02 NOTE — ED Notes (Signed)
Patients family voiced concern about antibiotic prescription due to being on it prior this week, MD made aware patient is requesting to speak to him again before discharge.

## 2022-10-30 ENCOUNTER — Ambulatory Visit (HOSPITAL_COMMUNITY)
Admission: RE | Admit: 2022-10-30 | Discharge: 2022-10-30 | Disposition: A | Discharge status: Home / Self Care | Payer: BC Managed Care – PPO | Source: Ambulatory Visit | Attending: Internal Medicine | Admitting: Internal Medicine

## 2022-10-30 ENCOUNTER — Other Ambulatory Visit: Payer: Self-pay | Admitting: Internal Medicine

## 2022-10-30 ENCOUNTER — Encounter (HOSPITAL_COMMUNITY): Payer: Self-pay | Admitting: Radiology

## 2022-10-30 DIAGNOSIS — R1012 Left upper quadrant pain: Secondary | ICD-10-CM | POA: Insufficient documentation

## 2022-10-30 DIAGNOSIS — K219 Gastro-esophageal reflux disease without esophagitis: Secondary | ICD-10-CM | POA: Diagnosis not present

## 2022-10-30 DIAGNOSIS — Z6827 Body mass index (BMI) 27.0-27.9, adult: Secondary | ICD-10-CM | POA: Diagnosis not present

## 2022-10-30 DIAGNOSIS — I10 Essential (primary) hypertension: Secondary | ICD-10-CM | POA: Diagnosis not present

## 2022-10-30 DIAGNOSIS — E663 Overweight: Secondary | ICD-10-CM | POA: Diagnosis not present

## 2022-10-30 DIAGNOSIS — M5414 Radiculopathy, thoracic region: Secondary | ICD-10-CM | POA: Diagnosis not present

## 2022-10-30 DIAGNOSIS — I700 Atherosclerosis of aorta: Secondary | ICD-10-CM | POA: Diagnosis not present

## 2022-10-30 MED ORDER — IOHEXOL 300 MG/ML  SOLN
100.0000 mL | Freq: Once | INTRAMUSCULAR | Status: AC | PRN
  Administered 2022-10-30: 100 mL via INTRAVENOUS

## 2022-11-07 DIAGNOSIS — Z0001 Encounter for general adult medical examination with abnormal findings: Secondary | ICD-10-CM | POA: Diagnosis not present

## 2022-11-07 DIAGNOSIS — K219 Gastro-esophageal reflux disease without esophagitis: Secondary | ICD-10-CM | POA: Diagnosis not present

## 2022-11-07 DIAGNOSIS — Z1331 Encounter for screening for depression: Secondary | ICD-10-CM | POA: Diagnosis not present

## 2022-11-07 DIAGNOSIS — E559 Vitamin D deficiency, unspecified: Secondary | ICD-10-CM | POA: Diagnosis not present

## 2022-11-07 DIAGNOSIS — Z6827 Body mass index (BMI) 27.0-27.9, adult: Secondary | ICD-10-CM | POA: Diagnosis not present

## 2022-11-07 DIAGNOSIS — I10 Essential (primary) hypertension: Secondary | ICD-10-CM | POA: Diagnosis not present

## 2022-11-07 DIAGNOSIS — E538 Deficiency of other specified B group vitamins: Secondary | ICD-10-CM | POA: Diagnosis not present

## 2022-11-07 DIAGNOSIS — M5414 Radiculopathy, thoracic region: Secondary | ICD-10-CM | POA: Diagnosis not present

## 2022-11-07 DIAGNOSIS — E663 Overweight: Secondary | ICD-10-CM | POA: Diagnosis not present

## 2024-05-05 ENCOUNTER — Encounter: Payer: Self-pay | Admitting: Physician Assistant
